# Patient Record
Sex: Female | Born: 1970 | Race: Black or African American | Hispanic: No | Marital: Single | Smoking: Never smoker
Health system: Southern US, Community
[De-identification: ages and names within clinical notes are randomized; demographics above are authoritative.]

## PROBLEM LIST (undated history)

## (undated) DIAGNOSIS — E119 Type 2 diabetes mellitus without complications: Secondary | ICD-10-CM

## (undated) DIAGNOSIS — R0683 Snoring: Secondary | ICD-10-CM

## (undated) DIAGNOSIS — E079 Disorder of thyroid, unspecified: Secondary | ICD-10-CM

## (undated) DIAGNOSIS — M329 Systemic lupus erythematosus, unspecified: Secondary | ICD-10-CM

## (undated) DIAGNOSIS — E039 Hypothyroidism, unspecified: Secondary | ICD-10-CM

## (undated) DIAGNOSIS — G4710 Hypersomnia, unspecified: Secondary | ICD-10-CM

## (undated) DIAGNOSIS — R0902 Hypoxemia: Secondary | ICD-10-CM

## (undated) DIAGNOSIS — G478 Other sleep disorders: Principal | ICD-10-CM

## (undated) DIAGNOSIS — M8580 Other specified disorders of bone density and structure, unspecified site: Secondary | ICD-10-CM

## (undated) DIAGNOSIS — T7840XA Allergy, unspecified, initial encounter: Secondary | ICD-10-CM

## (undated) HISTORY — DX: Allergy, unspecified, initial encounter: T78.40XA

## (undated) HISTORY — DX: Systemic lupus erythematosus, unspecified: M32.9

## (undated) HISTORY — DX: Hypersomnia, unspecified: G47.10

## (undated) HISTORY — DX: Other sleep disorders: G47.8

## (undated) HISTORY — DX: Type 2 diabetes mellitus without complications: E11.9

## (undated) HISTORY — DX: Snoring: R06.83

## (undated) HISTORY — DX: Hypoxemia: R09.02

## (undated) HISTORY — DX: Disorder of thyroid, unspecified: E07.9

## (undated) HISTORY — DX: Other specified disorders of bone density and structure, unspecified site: M85.80

## (undated) HISTORY — DX: Hypothyroidism, unspecified: E03.9

---

## 2001-05-30 ENCOUNTER — Emergency Department (HOSPITAL_COMMUNITY): Admission: EM | Admit: 2001-05-30 | Discharge: 2001-05-30 | Payer: Self-pay | Admitting: Emergency Medicine

## 2001-05-30 ENCOUNTER — Encounter: Payer: Self-pay | Admitting: Emergency Medicine

## 2007-10-14 HISTORY — PX: THYROIDECTOMY: SHX17

## 2014-05-01 ENCOUNTER — Ambulatory Visit (INDEPENDENT_AMBULATORY_CARE_PROVIDER_SITE_OTHER): Payer: PRIVATE HEALTH INSURANCE | Admitting: Emergency Medicine

## 2014-05-01 VITALS — BP 108/66 | HR 71 | Temp 98.4°F | Resp 16 | Ht 71.75 in | Wt 258.0 lb

## 2014-05-01 DIAGNOSIS — G4733 Obstructive sleep apnea (adult) (pediatric): Secondary | ICD-10-CM

## 2014-05-01 DIAGNOSIS — E039 Hypothyroidism, unspecified: Secondary | ICD-10-CM

## 2014-05-01 LAB — COMPREHENSIVE METABOLIC PANEL
ALT: 54 U/L — ABNORMAL HIGH (ref 0–35)
AST: 41 U/L — ABNORMAL HIGH (ref 0–37)
Albumin: 4.3 g/dL (ref 3.5–5.2)
Alkaline Phosphatase: 59 U/L (ref 39–117)
BUN: 11 mg/dL (ref 6–23)
CO2: 28 mEq/L (ref 19–32)
Calcium: 9.6 mg/dL (ref 8.4–10.5)
Chloride: 102 mEq/L (ref 96–112)
Creat: 0.7 mg/dL (ref 0.50–1.10)
Glucose, Bld: 94 mg/dL (ref 70–99)
Potassium: 3.7 mEq/L (ref 3.5–5.3)
Sodium: 138 mEq/L (ref 135–145)
Total Bilirubin: 0.6 mg/dL (ref 0.2–1.2)
Total Protein: 8.2 g/dL (ref 6.0–8.3)

## 2014-05-01 LAB — CBC WITH DIFFERENTIAL/PLATELET
Basophils Absolute: 0 10*3/uL (ref 0.0–0.1)
Basophils Relative: 0 % (ref 0–1)
Eosinophils Absolute: 0.2 10*3/uL (ref 0.0–0.7)
Eosinophils Relative: 3 % (ref 0–5)
HCT: 39.6 % (ref 36.0–46.0)
Hemoglobin: 14 g/dL (ref 12.0–15.0)
Lymphocytes Relative: 32 % (ref 12–46)
Lymphs Abs: 2.3 10*3/uL (ref 0.7–4.0)
MCH: 27.2 pg (ref 26.0–34.0)
MCHC: 35.4 g/dL (ref 30.0–36.0)
MCV: 77 fL — ABNORMAL LOW (ref 78.0–100.0)
Monocytes Absolute: 0.4 10*3/uL (ref 0.1–1.0)
Monocytes Relative: 6 % (ref 3–12)
Neutro Abs: 4.2 10*3/uL (ref 1.7–7.7)
Neutrophils Relative %: 59 % (ref 43–77)
Platelets: 246 10*3/uL (ref 150–400)
RBC: 5.14 MIL/uL — ABNORMAL HIGH (ref 3.87–5.11)
RDW: 14.2 % (ref 11.5–15.5)
WBC: 7.1 10*3/uL (ref 4.0–10.5)

## 2014-05-01 LAB — LIPID PANEL
Cholesterol: 191 mg/dL (ref 0–200)
HDL: 55 mg/dL (ref >39)
LDL Cholesterol: 116 mg/dL — ABNORMAL HIGH (ref 0–99)
Total CHOL/HDL Ratio: 3.5 Ratio
Triglycerides: 102 mg/dL (ref <150)
VLDL: 20 mg/dL (ref 0–40)

## 2014-05-01 LAB — RHEUMATOID FACTOR: Rheumatoid fact SerPl-aCnc: <10 IU/mL (ref <=14)

## 2014-05-01 MED ORDER — LEVOTHYROXINE SODIUM 150 MCG PO TABS: 150.0000 ug | ORAL_TABLET | Freq: Every day | ORAL | Status: DC

## 2014-05-01 NOTE — Progress Notes (Signed)
[facility] [address], [place] [id number] [phone] [phone]  Date:  05/01/2014   Name:  Deborah MemosJanice L Rosales   DOB:  05/22/1971   MRN:  [id number]  PCP:  No PCP Per Patient    Chief Complaint: Thyroid Problem   History of Present Illness:  Deborah Rosales is a 43 y.o. very pleasant female patient who presents with the following:  Here for the "summer" and would like to get a sleep study as she is not confident in the study done in [place].  Has history of acquired hypothyroidism and borderline lupus and needs updates on those labs as well.  Tolerating her medication doses well and needs refills.  No improvement with over the counter medications or other home remedies. Denies other complaint or health concern today.   There are no active problems to display for this patient.   Past Medical History  Diagnosis Date  . Allergy   . Osteopenia   . Thyroid disease   . Hypothyroidism   . Lupus     Past Surgical History  Procedure Laterality Date  . Thyroidectomy      History  Substance Use Topics  . Smoking status: Never Smoker   . Smokeless tobacco: Not on file  . Alcohol Use: Yes     Comment: 2    Family History  Problem Relation Age of Onset  . Hyperlipidemia Mother   . Hypertension Mother   . Hypertension Father   . Hypertension Brother   . Mental retardation Sister   . Hypertension Maternal Grandmother   . Stroke Maternal Grandmother   . Hypertension Maternal Grandfather   . Hypertension Paternal Grandmother   . Heart disease Paternal Grandmother   . Diabetes Paternal Grandfather   . Hypertension Paternal Grandfather     No Known Allergies  Medication list has been reviewed and updated.  No current outpatient prescriptions on file prior to visit.   No current facility-administered medications on file prior to visit.    Review of Systems:  As per HPI, otherwise negative.    Physical Examination: Filed Vitals:   05/01/14 1535  BP:  108/66  Pulse: 71  Temp: 98.4 F (36.9 C)  Resp: 16   Filed Vitals:   05/01/14 1535  Height: 5' 11.75" (1.822 m)  Weight: 258 lb (117.028 kg)   Body mass index is 35.25 kg/(m^2). Ideal Body Weight: Weight in (lb) to have BMI = 25: 182.7  GEN: WDWN, NAD, Non-toxic, A & O x 3 HEENT: Atraumatic, Normocephalic. Neck supple. No masses, No LAD. Ears and Nose: No external deformity. CV: RRR, No M/G/R. No JVD. No thrill. No extra heart sounds. PULM: CTA B, no wheezes, crackles, rhonchi. No retractions. No resp. distress. No accessory muscle use. ABD: S, NT, ND, +BS. No rebound. No HSM. EXTR: No c/c/e NEURO Normal gait.  PSYCH: Normally interactive. Conversant. Not depressed or anxious appearing.  Calm demeanor.    Assessment and Plan: Sleep apnea Acquired hypothyroidism off meds for 2 weeks  Signed,  Phillips OdorJeffery Raj Landress, MD

## 2014-05-02 LAB — ANA: Anti Nuclear Antibody(ANA): NEGATIVE

## 2014-05-04 ENCOUNTER — Ambulatory Visit (INDEPENDENT_AMBULATORY_CARE_PROVIDER_SITE_OTHER): Payer: PRIVATE HEALTH INSURANCE | Admitting: Neurology

## 2014-05-04 ENCOUNTER — Encounter: Payer: Self-pay | Admitting: Neurology

## 2014-05-04 VITALS — BP 128/86 | HR 77 | Resp 16 | Ht 73.0 in | Wt 244.0 lb

## 2014-05-04 DIAGNOSIS — R0609 Other forms of dyspnea: Secondary | ICD-10-CM

## 2014-05-04 DIAGNOSIS — R0989 Other specified symptoms and signs involving the circulatory and respiratory systems: Secondary | ICD-10-CM

## 2014-05-04 DIAGNOSIS — R0683 Snoring: Secondary | ICD-10-CM | POA: Insufficient documentation

## 2014-05-04 DIAGNOSIS — G4730 Sleep apnea, unspecified: Principal | ICD-10-CM

## 2014-05-04 DIAGNOSIS — G4710 Hypersomnia, unspecified: Secondary | ICD-10-CM | POA: Insufficient documentation

## 2014-05-04 HISTORY — DX: Hypersomnia, unspecified: G47.10

## 2014-05-04 HISTORY — DX: Snoring: R06.83

## 2014-05-04 NOTE — Progress Notes (Signed)
[facility]  Provider:  Melvyn Novas, M D  Referring Provider: Phillips Odor, MD Primary Care Physician:  No PCP Per Patient    OSA evaluation:   HPI:  Deborah Rosales Rosales is a 43 y.o., left handed  female, who is seen here as a referral  from Dr. Weyman Rosales for a sleep evaluation.  Deborah Rosales Rosales had been evaluated for the possibility of sleep apnea based and on a constant of snoring and daytime fatigue,  as well as morning headaches. The evaluation took place in January 2013 in [place], She was first evaluated in [place] in a specialty clinic for Ear,  Nose and Throat physicians.  A home sleep test was performed and showed that but she had some desaturations ( oxygen levels) she seemed not to have frank apneas.  The study concluded that the data collection time was too Rosales to give a valid reading. The recorded data encompassed 10 hours and 10 minutes but the validity of the recording state that only 10 minutes per day to that were usable  She also underwent a nocturnal pulse oximetry which showed her pulse rate to be +/-15 around 60 beats per minute. The study gave no evidence of frank apneas. Soon after , she moved to another city, [place] and had been scheduled at the [facility] for a repeat this time in the lab study that she could finally not attend.  She's here today to see 2-1/2 years later if there is by no evidence of sleep apnea, evidence of upper airway resistance he, will just simply snoring.  In addition, her complaint of morning headaches after waking up has increased in intensity and frequency.  She is also considered extremely daytime sleepy. She has gained weight over the last 2-1/2 years one of her concerns is a possibility of hypoventilation and CO2 retention.   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 In, she may read to 1 AM before she finally falls asleep. She also doesn't have a set rise time in summer. She has however noticed that she wakes up with headaches in the morning but is not awoken by headaches. She I was falls asleep promptly, and will sleep through the night unfortunately she will rely on more than one alarm to be able to rise in the morning. She does not wake up spontaneously. She often feels that she would like another couple of hours of sleep in the morning and feels neither refreshed nor restored. She doesn 't recall any dreams.  The patient often naps in the afternoon even after 8-10 hours of nocturnal sleep. The problem here as a gun but she does not wake up from a nap easily and that she sometimes falls asleep in the late afternoon and her now just leads into a nocturnal sleep. When she wakes up , she has a generalized headache, throbbing , retreating over the early hours of the day.   She drinks caffeine , but not excessively- one cup in AM . No energy drinks. No SODA, no iced tea. On weekends she likes to drink Beer.  She is  physically active , teaches and takes Zumba classes. She does not use tobacco in any form,      Review of Systems: Out of a complete 14 system review, the patient complains of only the following symptoms,  and all other reviewed systems are negative.The patient endorsed the Epworth sleepiness score at 16 points and fatigue severity at 46 points. She is status posture thyroidectomy left lobe and has lupus.     History   Social History  . Marital Status: Single    Spouse Name: N/A    Number of Children: 0  . Years of Education: Masters   Occupational History  .     Social History Main Topics  . Smoking status: Never Smoker   . Smokeless tobacco: Never Used  . Alcohol Use: No     Comment: 1  . Drug Use: No  . Sexual Activity: Not on file   Other Topics Concern  . Not on file   Social History Narrative   Patient is single and  lives alone.   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   Patient is left-handed.   Patient drinks three cups of coffee per week and two cups of soda per week.    Family History  Problem Relation Age of Onset  . Hyperlipidemia Mother   . Hypertension Mother   . Hypertension Father   . Hypertension Brother   . Mental retardation Sister   . Hypertension Maternal Grandmother   . Stroke Maternal Grandmother   . Hypertension Maternal Grandfather   . Hypertension Paternal Grandmother   . Heart disease Paternal Grandmother   . Hypertension Paternal Grandfather   . Glaucoma Maternal Grandfather   . Diabetes Paternal Grandmother     Past Medical History  Diagnosis Date  . Allergy   . Osteopenia   . Thyroid disease   . Hypothyroidism   . Lupus     Past Surgical History  Procedure Laterality Date  . Thyroidectomy Left 2009    Current Outpatient Prescriptions  Medication Sig Dispense Refill  . levothyroxine (SYNTHROID, LEVOTHROID) 150 MCG tablet Take 1 tablet (150 mcg total) by mouth daily before breakfast.  30 tablet  0  . UNABLE TO FIND Take by mouth daily. FEMOSTEN 1mg /5mg        No current facility-administered medications for this visit.    Allergies as of 05/04/2014  . (No Known Allergies)    Vitals: BP 128/86  Pulse 77  Resp 16  Ht 6\' 1"  (1.854 m)  Wt 244 lb (110.678 kg)  BMI 32.20 kg/m2 Last Weight:  Wt Readings from Last 1 Encounters:  05/04/14 244 lb (110.678 kg)   Last Height:   Ht Readings from Last 1 Encounters:  05/04/14 6\' 1"  (1.854 m)    Physical exam:  General: The patient is awake, alert and appears not in acute distress. The patient is well groomed. Head: Normocephalic, atraumatic. Neck is supple. Mallampati 4 , neck circumference:  17. 25 inches. No nasal septal deviation, no TMJ.  Habitual mouth breather.   Cardiovascular:  Regular rate and rhythm, without  murmurs or carotid bruit, and without distended neck  veins. Respiratory: Lungs are clear to auscultation. Skin:  Without evidence of edema, or rash Trunk: BMI is  elevated /patient has normal posture.  Neurologic exam : The patient is awake and alert, oriented to place and time.  Memory subjective described as intact.  There is a normal attention span & concentration ability. Speech is fluent without  dysarthria, dysphonia or aphasia. Mood and affect are appropriate.  Cranial nerves: Pupils are equal and briskly reactive to light. Funduscopic exam without  evidence of pallor or edema.  Extraocular movements  in  vertical and horizontal planes intact and without nystagmus. Visual fields by finger perimetry are intact. Hearing to finger rub intact.  Facial sensation intact to fine touch. Facial motor strength is symmetric and tongue and uvula move midline.  Motor exam:  Normal tone , muscle bulk and symmetric normal strength in all extremities.  Sensory:  Fine touch, pinprick and vibration were normal. Proprioception is normal.  Coordination: Rapid alternating movements in the fingers/hands is tested and normal.  Finger-to-nose maneuver tested and normal without evidence of ataxia, dysmetria or tremor.  Gait and station: Patient walks without assistive device . Strength within normal limits.  Stance is stable and normal.  Deep tendon reflexes: in the  upper and lower extremities are symmetric and intact. Babinski maneuver response is  downgoing.   Assessment:  After physical and neurologic examination, review of laboratory studies, imaging, neurophysiology testing and pre-existing records, assessment is  1) patient is known to snore very loudly, and has been gasping for air. She has nocturnal flatulence, and she has headaches in AM.  She endorses  severe daytime sleepiness, without dream intrusion, sleep paralysis, without sleep fragmentation.   Plan:  Treatment plan and additional workup : 2) SPLOT study with CO2 ,  Meeting after sleep  study ASAP before patient returns to [place].

## 2014-05-04 NOTE — Patient Instructions (Signed)
Hypersomnia Hypersomnia usually brings recurrent episodes of excessive daytime sleepiness or prolonged nighttime sleep. It is different than feeling tired due to lack of or interrupted sleep at night. People with hypersomnia are compelled to nap repeatedly during the day. This is often at inappropriate times such as:  At work.  During a meal.  In conversation. These daytime naps usually provide no relief. This disorder typically affects adolescents and young adults. CAUSES  This condition may be caused by:  Another sleep disorder (such as narcolepsy or sleep apnea).  Dysfunction of the autonomic nervous system.  Drug or alcohol abuse.  A physical problem, such as:  A tumor.  Head trauma. This is damage caused by an accident.  Injury to the central nervous system.  Certain medications, or medicine withdrawal.  Medical conditions may contribute to the disorder, including:  Multiple sclerosis.  Depression.  Encephalitis.  Epilepsy.  Obesity.  Some people appear to have a genetic predisposition to this disorder. In others, there is no known cause. SYMPTOMS   Patients often have difficulty waking from a long sleep. They may feel dazed or confused.  Other symptoms may include:  Anxiety.  Increased irritation (inflammation).  Decreased energy.  Restlessness.  Slow thinking.  Slow speech.  Loss of appetite.  Hallucinations.  Memory difficulty.  Tremors, Tics.  Some patients lose the ability to function in family, social, occupational, or other settings. TREATMENT  Treatment is symptomatic in nature. Stimulants and other drugs may be used to treat this disorder. Changes in behavior may help. For example, avoid night work and social activities that delay bed time. Changes in diet may offer some relief. Patients should avoid alcohol and caffeine. PROGNOSIS  The likely outcome (prognosis) for persons with hypersomnia depends on the cause of the disorder.  The disorder itself is not life threatening. But it can have serious consequences. For example, automobile accidents can be caused by falling asleep while driving. The attacks usually continue indefinitely. Document Released: 09/19/2002 Document Revised: 12/22/2011 Document Reviewed: 08/23/2008 ExitCare Patient Information 2015 [employer]. This information is not intended to replace advice given to you by your health care provider. Make sure you discuss any questions you have with your health care provider.  

## 2014-05-07 ENCOUNTER — Encounter (INDEPENDENT_AMBULATORY_CARE_PROVIDER_SITE_OTHER): Payer: PRIVATE HEALTH INSURANCE

## 2014-05-07 DIAGNOSIS — G4730 Sleep apnea, unspecified: Principal | ICD-10-CM

## 2014-05-07 DIAGNOSIS — G4733 Obstructive sleep apnea (adult) (pediatric): Secondary | ICD-10-CM

## 2014-05-07 DIAGNOSIS — R0683 Snoring: Secondary | ICD-10-CM

## 2014-05-07 DIAGNOSIS — G4710 Hypersomnia, unspecified: Secondary | ICD-10-CM

## 2014-05-07 DIAGNOSIS — R0902 Hypoxemia: Secondary | ICD-10-CM

## 2014-05-15 ENCOUNTER — Ambulatory Visit (INDEPENDENT_AMBULATORY_CARE_PROVIDER_SITE_OTHER): Payer: PRIVATE HEALTH INSURANCE | Admitting: Neurology

## 2014-05-15 ENCOUNTER — Encounter: Payer: Self-pay | Admitting: Neurology

## 2014-05-15 VITALS — BP 127/85 | HR 62 | Ht 73.0 in | Wt 244.0 lb

## 2014-05-15 DIAGNOSIS — G478 Other sleep disorders: Secondary | ICD-10-CM

## 2014-05-15 DIAGNOSIS — E669 Obesity, unspecified: Secondary | ICD-10-CM

## 2014-05-15 DIAGNOSIS — G4733 Obstructive sleep apnea (adult) (pediatric): Secondary | ICD-10-CM

## 2014-05-15 DIAGNOSIS — R0902 Hypoxemia: Secondary | ICD-10-CM

## 2014-05-15 DIAGNOSIS — G4481 Hypnic headache: Secondary | ICD-10-CM

## 2014-05-15 HISTORY — DX: Other sleep disorders: G47.8

## 2014-05-15 HISTORY — DX: Hypoxemia: R09.02

## 2014-05-15 NOTE — Progress Notes (Signed)
[facility]  Provider:  Melvyn Novasarmen  Deborah Rosales, M D  Referring Provider: No ref. provider found Primary Care Physician:     [facility]A evaluation:   HPI:  Deborah Rosales is a 43 y.o., left handed  female, who is seen here as a referral  from Dr. Thornton PapasJeffrey Anderson  for a sleep evaluation.   Interval history :  05-07-14  PSG performed : The sleep study revealed mild apnea , 46 torr of CO2 retention.  But the patient had low oxygen levels (nadir 79%)  for a rather prolonged period of time.  Her REM sleep dependent AHI was 11.7 RDI was 15.2 , supine  AHI was 16.4.   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 She needs to avoid supine sleep, as  only when sleeping on her back is apnea evident. We discussed the Tennisball method. She will resume weight watchers. She still has headaches in AM, hypnic headaches.  CPAP will allow for better hypoxemia control. CPAP to be set auto 5-12 cm water.  CD     Last visit.   Mr. Deborah Rosales had been evaluated for the possibility of sleep apnea based and on a constant of snoring and daytime fatigue,  as well as morning headaches. The evaluation took place in January 2013 in [place], She was first evaluated in [place] in a specialty clinic for Ear,  Nose and Throat physicians.  A home sleep test was performed and showed that but she had some desaturations ( oxygen levels) she seemed not to have frank apneas.  The study concluded that the data collection time was too short to give a valid reading. The recorded data encompassed 10 hours and 10 minutes but the validity of the recording state that only 10 minutes per day to that were usable  She also underwent a nocturnal pulse oximetry which showed her  pulse rate to be +/-15 around 60 beats per minute. The study gave no evidence of frank apneas. Soon after , she moved to another city, [place] and had been [facility] for a repeat this time in the lab study that she could finally not attend.  She's here today to see 2-1/2 years later if there is by no evidence of sleep apnea, evidence of upper airway resistance he, will just simply snoring.  In addition, her complaint of morning headaches after waking up has increased in intensity and frequency.  She is also considered extremely daytime sleepy. She has gained weight over the last 2-1/2 years one of her concerns is a possibility of hypoventilation and CO2 retention.   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 In, she may read to 1 AM before she finally falls asleep. She also doesn't have a set rise time in summer. She has however noticed that she wakes up with headaches in the morning but is not awoken by headaches. She I was falls asleep promptly, and will sleep through the night unfortunately she will rely on more than one alarm to be able to rise in the morning. She does not wake up spontaneously. She often feels that she would like  another couple of hours of sleep in the morning and feels neither refreshed nor restored. She doesn 't recall any dreams.  The patient often naps in the afternoon even after 8-10 hours of nocturnal sleep. The problem here as a gun but she does not wake up from a nap easily and that she sometimes falls asleep in the late afternoon and her now just leads into a nocturnal sleep. When she wakes up , she has a generalized headache, throbbing , retreating over the early hours of the day.   She drinks caffeine , but not excessively- one cup in AM . No energy drinks. No SODA, no iced tea. On weekends she likes to drink Beer.  She is   physically active , teaches and takes Zumba classes. She does not use tobacco in any form,      Review of Systems: Out of a complete 14 system review, the patient complains of only the following symptoms, and all other reviewed systems are negative.The patient endorsed the Epworth sleepiness score at 16 points and fatigue severity at 46 points. She is status posture thyroidectomy left lobe and has lupus.     History   Social History  . Marital Status: Single    Spouse Name: N/A    Number of Children: 0  . Years of Education: Masters   Occupational History  .     Social History Main Topics  . Smoking status: Never Smoker   . Smokeless tobacco: Never Used  . Alcohol Use: Yes     Comment: occas.  . Drug Use: No  . Sexual Activity: Not on file   Other Topics Concern  . Not on file   Social History Narrative   Patient is single and lives alone.   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   Patient is left-handed.   Patient drinks three cups of coffee per week and two cups of soda per week.    Family History  Problem Relation Age of Onset  . Hyperlipidemia Mother   . Hypertension Mother   . Hypertension Father   . Hypertension Brother   . Mental retardation Sister   . Hypertension Maternal Grandmother   . Stroke Maternal Grandmother   . Hypertension Maternal Grandfather   . Hypertension Paternal Grandmother   . Heart disease Paternal Grandmother   . Hypertension Paternal Grandfather   . Glaucoma Maternal Grandfather   . Diabetes Paternal Grandmother     Past Medical History  Diagnosis Date  . Allergy   . Osteopenia   . Thyroid disease   . Hypothyroidism   . Lupus   . 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 Snoring 05/04/2014    Past Surgical History  Procedure Laterality Date  . Thyroidectomy Left 2009    Current Outpatient Prescriptions  Medication Sig Dispense Refill  . levothyroxine (SYNTHROID, LEVOTHROID) 150 MCG tablet  Take 1 tablet (150 mcg total) by mouth daily before breakfast.  30 tablet  0  . UNABLE TO FIND Take by mouth daily. FEMOSTEN 1mg /5mg        No current facility-administered medications for this visit.    Allergies as of 05/15/2014  . (No Known Allergies)    Vitals: BP 127/85  Pulse 62  Ht 6\' 1"  (1.854 m)  Wt 244 lb (110.678 kg)  BMI 32.20 kg/m2 Last Weight:  Wt Readings from Last 1 Encounters:  05/15/14 244 lb (110.678 kg)   Last Height:   Ht Readings from  Last 1 Encounters:  05/15/14 6\' 1"  (1.854 m)    Physical exam:  General: The patient is awake, alert and appears not in acute distress. The patient is well groomed. Head: Normocephalic, atraumatic. Neck is supple. Mallampati 4 , neck circumference:  17. 25 inches. No nasal septal deviation, no TMJ.  Habitual mouth breather.   Cardiovascular:  Regular rate and rhythm, without  murmurs or carotid bruit, and without distended neck veins. Respiratory: Lungs are clear to auscultation. Skin:  Without evidence of edema, or rash Trunk: BMI is  elevated /patient has normal posture.  Neurologic exam : The patient is awake and alert, oriented to place and time.  Memory subjective described as intact.  There is a normal attention span & concentration ability. Speech is fluent without  dysarthria, dysphonia or aphasia. Mood and affect are appropriate.  Cranial nerves: Pupils are equal and briskly reactive to light. Hearing to finger rub intact.  Facial sensation intact to fine touch. Facial motor strength is symmetric and tongue and uvula move midline.  Motor exam:  Normal tone , muscle bulk and symmetric normal strength in all extremities..  Stance is stable and normal.  Deep tendon reflexes: in the  upper and lower extremities are symmetric and intact. Babinski maneuver response is  downgoing.   Assessment:  After physical and neurologic examination, review of laboratory studies, imaging, neurophysiology testing and pre-existing  records, assessment is  1) OSA with UARS, hypothyroidism, orthopnea. obesity   Plan:  Treatment plan and additional workup :I would advise for CPAP  treatment : this  mild sleep apnea with upper airway resistance syndrome can be both treated using positive airway therapy- in addition to pursue aggressive weight-loss strategies. She needs to avoid supine sleep, as  only when sleeping on her back is apnea evident. We discussed the Tennisball method. She will resume weight watchers. She still has headaches in AM, hypnic headaches.  CPAP will allow for better hypoxemia control. CPAP to be set auto 5-12 cm water.  CD

## 2014-05-19 ENCOUNTER — Other Ambulatory Visit (INDEPENDENT_AMBULATORY_CARE_PROVIDER_SITE_OTHER): Payer: PRIVATE HEALTH INSURANCE

## 2014-05-19 DIAGNOSIS — E039 Hypothyroidism, unspecified: Secondary | ICD-10-CM

## 2014-05-19 NOTE — Progress Notes (Unsigned)
Patient here today for labs only. °

## 2014-05-20 LAB — TSH: TSH: 0.709 u[IU]/mL (ref 0.350–4.500)

## 2014-05-25 ENCOUNTER — Emergency Department (HOSPITAL_COMMUNITY)
Admission: EM | Admit: 2014-05-25 | Discharge: 2014-05-25 | Disposition: A | Discharge status: Home / Self Care | Payer: 59 | Attending: Emergency Medicine | Admitting: Emergency Medicine

## 2014-05-25 ENCOUNTER — Emergency Department (HOSPITAL_COMMUNITY): Payer: 59

## 2014-05-25 ENCOUNTER — Encounter (HOSPITAL_COMMUNITY): Payer: Self-pay | Admitting: Emergency Medicine

## 2014-05-25 DIAGNOSIS — N200 Calculus of kidney: Secondary | ICD-10-CM | POA: Diagnosis not present

## 2014-05-25 DIAGNOSIS — Z8709 Personal history of other diseases of the respiratory system: Secondary | ICD-10-CM | POA: Diagnosis not present

## 2014-05-25 DIAGNOSIS — Z79899 Other long term (current) drug therapy: Secondary | ICD-10-CM | POA: Diagnosis not present

## 2014-05-25 DIAGNOSIS — E039 Hypothyroidism, unspecified: Secondary | ICD-10-CM | POA: Insufficient documentation

## 2014-05-25 DIAGNOSIS — Z8739 Personal history of other diseases of the musculoskeletal system and connective tissue: Secondary | ICD-10-CM | POA: Diagnosis not present

## 2014-05-25 DIAGNOSIS — R109 Unspecified abdominal pain: Secondary | ICD-10-CM | POA: Diagnosis present

## 2014-05-25 DIAGNOSIS — Z3202 Encounter for pregnancy test, result negative: Secondary | ICD-10-CM | POA: Diagnosis not present

## 2014-05-25 DIAGNOSIS — M329 Systemic lupus erythematosus, unspecified: Secondary | ICD-10-CM | POA: Insufficient documentation

## 2014-05-25 LAB — CBC WITH DIFFERENTIAL/PLATELET
Basophils Absolute: 0 10*3/uL (ref 0.0–0.1)
Basophils Relative: 0 % (ref 0–1)
EOS ABS: 0 10*3/uL (ref 0.0–0.7)
Eosinophils Relative: 0 % (ref 0–5)
HEMATOCRIT: 40.3 % (ref 36.0–46.0)
Hemoglobin: 13.9 g/dL (ref 12.0–15.0)
LYMPHS ABS: 1.7 10*3/uL (ref 0.7–4.0)
LYMPHS PCT: 14 % (ref 12–46)
MCH: 27.4 pg (ref 26.0–34.0)
MCHC: 34.5 g/dL (ref 30.0–36.0)
MCV: 79.5 fL (ref 78.0–100.0)
MONO ABS: 0.9 10*3/uL (ref 0.1–1.0)
Monocytes Relative: 8 % (ref 3–12)
Neutro Abs: 9.2 10*3/uL — ABNORMAL HIGH (ref 1.7–7.7)
Neutrophils Relative %: 78 % — ABNORMAL HIGH (ref 43–77)
PLATELETS: 256 10*3/uL (ref 150–400)
RBC: 5.07 MIL/uL (ref 3.87–5.11)
RDW: 12.9 % (ref 11.5–15.5)
WBC: 11.7 10*3/uL — ABNORMAL HIGH (ref 4.0–10.5)

## 2014-05-25 LAB — URINE MICROSCOPIC-ADD ON

## 2014-05-25 LAB — COMPREHENSIVE METABOLIC PANEL
ALT: 48 U/L — AB (ref 0–35)
ANION GAP: 13 (ref 5–15)
AST: 39 U/L — ABNORMAL HIGH (ref 0–37)
Albumin: 4.3 g/dL (ref 3.5–5.2)
Alkaline Phosphatase: 65 U/L (ref 39–117)
BUN: 13 mg/dL (ref 6–23)
CALCIUM: 10.1 mg/dL (ref 8.4–10.5)
CO2: 27 meq/L (ref 19–32)
Chloride: 99 mEq/L (ref 96–112)
Creatinine, Ser: 1.12 mg/dL — ABNORMAL HIGH (ref 0.50–1.10)
GFR, EST AFRICAN AMERICAN: 68 mL/min — AB (ref >90)
GFR, EST NON AFRICAN AMERICAN: 58 mL/min — AB (ref >90)
Glucose, Bld: 106 mg/dL — ABNORMAL HIGH (ref 70–99)
Potassium: 4.1 mEq/L (ref 3.7–5.3)
SODIUM: 139 meq/L (ref 137–147)
TOTAL PROTEIN: 9 g/dL — AB (ref 6.0–8.3)
Total Bilirubin: 0.5 mg/dL (ref 0.3–1.2)

## 2014-05-25 LAB — PREGNANCY, URINE: Preg Test, Ur: NEGATIVE

## 2014-05-25 LAB — URINALYSIS, ROUTINE W REFLEX MICROSCOPIC
BILIRUBIN URINE: NEGATIVE
GLUCOSE, UA: NEGATIVE mg/dL
KETONES UR: NEGATIVE mg/dL
Nitrite: NEGATIVE
PH: 5.5 (ref 5.0–8.0)
Protein, ur: NEGATIVE mg/dL
Specific Gravity, Urine: 1.02 (ref 1.005–1.030)
Urobilinogen, UA: 0.2 mg/dL (ref 0.0–1.0)

## 2014-05-25 MED ORDER — OXYCODONE-ACETAMINOPHEN 5-325 MG PO TABS: 1.0000 | ORAL_TABLET | Freq: Four times a day (QID) | ORAL | Status: DC | PRN

## 2014-05-25 MED ORDER — OXYCODONE-ACETAMINOPHEN 5-325 MG PO TABS: 1.0000 | ORAL_TABLET | ORAL | Status: DC | PRN

## 2014-05-25 MED ORDER — ONDANSETRON HCL 4 MG/2ML IJ SOLN
4.0000 mg | Freq: Once | INTRAMUSCULAR | Status: AC
  Administered 2014-05-25: 4 mg via INTRAVENOUS
  Filled 2014-05-25: qty 2

## 2014-05-25 MED ORDER — HYDROMORPHONE HCL PF 1 MG/ML IJ SOLN
1.0000 mg | Freq: Once | INTRAMUSCULAR | Status: AC
  Administered 2014-05-25: 1 mg via INTRAVENOUS
  Filled 2014-05-25: qty 1

## 2014-05-25 NOTE — Discharge Instructions (Signed)
Drink plenty of fluids.  Follow up with your md when you get home.   Return if problems

## 2014-05-25 NOTE — ED Notes (Addendum)
Pt reports was seen at urgent care for same this am and was diagnosed with pelvic inflammation. Pt reports abdominal pain since this am. Pt was given a px for 2 abx and given a phenergan suppository at d/c from urgent care. Pt reports no improvement in abdominal pain. Pt reports decreased urine production, intermittent n/v. Pt alert, reports fatigue and malaise.

## 2014-05-25 NOTE — ED Notes (Signed)
Patient verbalizes understanding of discharge instructions, prescription medications, home care and follow up care. Patient ambulatory out of department at this time, escorted by family member

## 2014-05-25 NOTE — ED Notes (Signed)
Pt reports waking up with a huge pain in lower left side and had trouble urinating.  Pt reports she thought it was a UTI so she went to Urgent care and diagnosed pt with pelvic inflammation and was given an injection of rocephin.  Pt has n/v but denies any diarrhea.  Pt took suppository and doxycylcine hyclate and Metronidazole.

## 2014-05-25 NOTE — ED Provider Notes (Addendum)
CSN: [id number]     Arrival date & time 05/25/14  1750 History   First MD Initiated Contact with Patient 05/25/14 1838   This chart was scribed for Benny Lennert, MD by Gwenevere Abbot, ED scribe. This patient was seen in room APA05/APA05 and the patient's care was started at 6:41 PM.    Chief Complaint  Patient presents with  . Abdominal Pain    Patient is a 43 y.o. female presenting with abdominal pain. The history is provided by the patient. No language interpreter was used.  Abdominal Pain Pain location:  LUQ Pain radiates to:  Does not radiate Pain severity:  Moderate Timing:  Constant Progression:  Worsening Relieved by:  Nothing Associated symptoms: no chest pain, no cough, no diarrhea, no fatigue and no hematuria    HPI Comments:  Deborah Rosales is a 43 y.o. female who presents to the Emergency Department complaining of constant left upper abdominal pain, along with associated symptoms of  trouble urinating,and vomiting. Pt is unaware of LMP due to irregular cycle.   Past Medical History  Diagnosis Date  . Allergy   . Osteopenia   . Thyroid disease   . Hypothyroidism   . Lupus   . 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 Snoring 05/04/2014  . UARS (upper airway resistance syndrome) 05/15/2014  . Hypoxemia 05/15/2014   Past Surgical History  Procedure Laterality Date  . Thyroidectomy Left 2009   Family History  Problem Relation Age of Onset  . Hyperlipidemia Mother   . Hypertension Mother   . Hypertension Father   . Hypertension Brother   . Mental retardation Sister   . Hypertension Maternal Grandmother   . Stroke Maternal Grandmother   . Hypertension Maternal Grandfather   . Hypertension Paternal Grandmother   . Heart disease Paternal Grandmother   . Hypertension Paternal Grandfather   . Glaucoma Maternal Grandfather   . Diabetes Paternal Grandmother    History  Substance Use Topics  . Smoking status: Never Smoker   . Smokeless tobacco: Never Used  . Alcohol  Use: Yes     Comment: occas.   OB History   Grav Para Term Preterm Abortions TAB SAB Ect Mult Living                 Review of Systems  Constitutional: Negative for appetite change and fatigue.  HENT: Negative for congestion, ear discharge and sinus pressure.   Eyes: Negative for discharge.  Respiratory: Negative for cough.   Cardiovascular: Negative for chest pain.  Gastrointestinal: Positive for abdominal pain. Negative for diarrhea.  Genitourinary: Negative for frequency and hematuria.  Musculoskeletal: Negative for back pain.  Skin: Negative for rash.  Neurological: Negative for seizures and headaches.  Psychiatric/Behavioral: Negative for hallucinations.      Allergies  Influenza vaccines  Home Medications   Prior to Admission medications   Medication Sig Start Date End Date Taking? Authorizing Provider  doxycycline (VIBRAMYCIN) 100 MG capsule Take 100 mg by mouth 2 (two) times daily. 14 day course starting on 05/25/2014   Yes Historical Provider, MD  levothyroxine (SYNTHROID, LEVOTHROID) 150 MCG tablet Take 1 tablet (150 mcg total) by mouth daily before breakfast. 05/01/14  Yes Phillips Odor, MD  metroNIDAZOLE (FLAGYL) 500 MG tablet Take 500 mg by mouth 2 (two) times daily.   Yes Historical Provider, MD  promethazine (PHENERGAN) 25 MG suppository Place 25 mg rectally every 6 (six) hours as needed for nausea or vomiting. 14 day course starting on 05/25/2014  Yes Historical Provider, MD   BP 133/86  Pulse 83  Temp(Src) 99.2 F (37.3 C) (Oral)  Resp 20  Ht 6' (1.829 m)  Wt 242 lb (109.77 kg)  BMI 32.81 kg/m2  SpO2 100% Physical Exam  Constitutional: She is oriented to person, place, and time. She appears well-developed.  HENT:  Head: Normocephalic.  Eyes: Conjunctivae and EOM are normal. No scleral icterus.  Neck: Neck supple. No thyromegaly present.  Cardiovascular: Normal rate and regular rhythm.  Exam reveals no gallop and no friction rub.   No murmur  heard. Pulmonary/Chest: No stridor. She has no wheezes. She has no rales. She exhibits no tenderness.  Abdominal: She exhibits no distension. There is tenderness (Moderate LUQ tenderness). There is no rebound.  Musculoskeletal: Normal range of motion. She exhibits no edema.  Lymphadenopathy:    She has no cervical adenopathy.  Neurological: She is oriented to person, place, and time. She exhibits normal muscle tone. Coordination normal.  Skin: No rash noted. No erythema.  Psychiatric: She has a normal mood and affect. Her behavior is normal.    ED Course  Procedures  DIAGNOSTIC STUDIES: Oxygen Saturation is 100% on RA, normal by my interpretation.  COORDINATION OF CARE: 6:44 PM-Discussed treatment plan with pt at bedside and pt agreed to plan.  Labs Review Labs Reviewed  CBC WITH DIFFERENTIAL - Abnormal; Notable for the following:    WBC 11.7 (*)    Neutrophils Relative % 78 (*)    Neutro Abs 9.2 (*)    All other components within normal limits  COMPREHENSIVE METABOLIC PANEL - Abnormal; Notable for the following:    Glucose, Bld 106 (*)    Creatinine, Ser 1.12 (*)    Total Protein 9.0 (*)    AST 39 (*)    ALT 48 (*)    GFR calc non Af Amer 58 (*)    GFR calc Af Amer 68 (*)    All other components within normal limits  URINALYSIS, ROUTINE W REFLEX MICROSCOPIC - Abnormal; Notable for the following:    Hgb urine dipstick SMALL (*)    Leukocytes, UA TRACE (*)    All other components within normal limits  URINE MICROSCOPIC-ADD ON - Abnormal; Notable for the following:    Squamous Epithelial / LPF FEW (*)    Bacteria, UA FEW (*)    All other components within normal limits  PREGNANCY, URINE    Imaging Review No results found.   EKG Interpretation None      MDM   Final diagnoses:  None   Kidney stone      Benny LennertJoseph L Senai Kingsley, MD 05/25/14 2038  Benny LennertJoseph L Surina Storts, MD 05/25/14 2214

## 2014-06-02 MED FILL — Oxycodone w/ Acetaminophen Tab 5-325 MG: ORAL | Qty: 6 | Status: AC

## 2015-02-01 ENCOUNTER — Telehealth: Payer: Self-pay

## 2015-02-01 NOTE — Telephone Encounter (Signed)
Called patient and left her a message stating we need to reschedule her apt. On 04-03-2015 with Dr.Dohmeier. When patient calls back she will need to go in a thirty minute  Slot. Please reschedule the patient when she call's back. Thanks Annabelle Harmanana.  Sleep Patient.

## 2015-02-11 ENCOUNTER — Encounter: Payer: Self-pay | Admitting: Neurology

## 2015-02-20 NOTE — Telephone Encounter (Signed)
[name].  Sorry for the inconvenience.  We have kept your appt for the same day 04-03-15, but have changed to a later time 3:30pm.  Please let us know if this does not work out.  Thank you.

## 2015-04-02 ENCOUNTER — Other Ambulatory Visit: Payer: Self-pay

## 2015-04-02 ENCOUNTER — Telehealth: Payer: Self-pay | Admitting: Neurology

## 2015-04-02 ENCOUNTER — Telehealth: Payer: Self-pay

## 2015-04-02 DIAGNOSIS — E039 Hypothyroidism, unspecified: Secondary | ICD-10-CM

## 2015-04-02 NOTE — Telephone Encounter (Signed)
Patient is in [place] and would like to speak with Dr. Dareen Piano about getting a year long refill for levothyroxine. She can be reached through e-mail or [email or web address]

## 2015-04-02 NOTE — Telephone Encounter (Signed)
Spoke to pt, she is flying in from [place] tomorrow and wants to know if there is an appt this week with Dr. Vickey Huger. She also wants an endocrinology referral as she and Dr. Vickey Huger talked about last visit. Asked Dr. Vickey Huger if it was ok to work her in at 8:00 on Thursday, she said it was ok, and gave an order for pt to be referred to endocrinology to Dr. Lafe Garin at [facility].

## 2015-04-03 ENCOUNTER — Ambulatory Visit: Payer: Self-pay | Admitting: Neurology

## 2015-04-03 ENCOUNTER — Ambulatory Visit: Payer: PRIVATE HEALTH INSURANCE | Admitting: Neurology

## 2015-04-03 NOTE — Telephone Encounter (Signed)
Can we send her in a year's worth of levothyroxine?

## 2015-04-03 NOTE — Telephone Encounter (Signed)
Tomorrow, I think not. There are after all doctors in [place] and we have not seen her in a year. She needs regular care and we cannot provide it [place] apart. JA

## 2015-04-03 NOTE — Telephone Encounter (Signed)
Sent message through my chart letting pt know.  

## 2015-04-05 ENCOUNTER — Telehealth: Payer: Self-pay | Admitting: Neurology

## 2015-04-05 ENCOUNTER — Other Ambulatory Visit: Payer: Self-pay

## 2015-04-05 ENCOUNTER — Ambulatory Visit: Payer: Self-pay | Admitting: Neurology

## 2015-04-05 DIAGNOSIS — E039 Hypothyroidism, unspecified: Secondary | ICD-10-CM

## 2015-04-05 NOTE — Telephone Encounter (Signed)
Received a call from patient inquiring why her Internal Referral to [facility] was canceled. Advised patient I will request a new referral. Message sent to nurse for new request.

## 2015-04-05 NOTE — Telephone Encounter (Signed)
Outreached to patient and advised the her referral for to [facility] for Dr. Elvera Lennox has been resubmitted and that office will contact her for an appointment. Also listed patient temp contact ph# [phone] on the referral

## 2015-04-05 NOTE — Telephone Encounter (Signed)
Referral order placed again.

## 2015-04-05 NOTE — Progress Notes (Signed)
The referral was cancelled by [facility] because they thought the pt's primary address was [place]. Referral placed again. Our referral coordinator will contact them and the pt.

## 2015-04-09 ENCOUNTER — Encounter: Payer: Self-pay | Admitting: Neurology

## 2015-05-01 ENCOUNTER — Ambulatory Visit (INDEPENDENT_AMBULATORY_CARE_PROVIDER_SITE_OTHER): Payer: 59 | Admitting: Endocrinology

## 2015-05-01 ENCOUNTER — Telehealth: Payer: Self-pay | Admitting: Endocrinology

## 2015-05-01 ENCOUNTER — Encounter: Payer: Self-pay | Admitting: Endocrinology

## 2015-05-01 VITALS — BP 134/87 | HR 86 | Temp 98.5°F | Ht 73.0 in | Wt 239.0 lb

## 2015-05-01 DIAGNOSIS — Z0189 Encounter for other specified special examinations: Secondary | ICD-10-CM

## 2015-05-01 DIAGNOSIS — E039 Hypothyroidism, unspecified: Secondary | ICD-10-CM | POA: Insufficient documentation

## 2015-05-01 DIAGNOSIS — E890 Postprocedural hypothyroidism: Secondary | ICD-10-CM | POA: Diagnosis not present

## 2015-05-01 DIAGNOSIS — Z0000 Encounter for general adult medical examination without abnormal findings: Secondary | ICD-10-CM

## 2015-05-01 MED ORDER — LEVOTHYROXINE SODIUM 150 MCG PO TABS: 150.0000 ug | ORAL_TABLET | Freq: Every day | ORAL | Status: DC

## 2015-05-01 NOTE — Progress Notes (Signed)
Subjective:    Patient ID: Deborah Rosales, female    DOB: 05/13/1971, 44 y.o.   MRN: [id number]  HPI In 2009, pt had left thyroid lobectomy for a goiter. No malignancy was found.  she has been on prescribed thyroid hormone therapy since then.  she has never taken kelp or any other type of non-prescribed thyroid product.  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  She says it showed only the remaining right right lobe.  She is not considering a pregnancy.  she has never had XRT to the neck.  she has never been on amiodarone or lithium.  She ran out of the medication approx 3 months ago, as she has trouble getting it.  She has moderate arthralgias throughout the body, and assoc weight gain.   Past Medical History  Diagnosis Date  . Allergy   . Osteopenia   . Thyroid disease   . Hypothyroidism   . Lupus   . 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 Snoring 05/04/2014  . UARS (upper airway resistance syndrome) 05/15/2014  . Hypoxemia 05/15/2014    Past Surgical History  Procedure Laterality Date  . Thyroidectomy Left 2009    History   Social History  . Marital Status: Single    Spouse Name: N/A  . Number of Children: 0  . Years of Education: Masters   Occupational History  .     Social History Main Topics  . Smoking status: Never Smoker   . Smokeless tobacco: Never Used  . Alcohol Use: Yes     Comment: occas.  . Drug Use: No  . Sexual Activity: No   Other Topics Concern  . Not on file   Social History Narrative   Patient is single and lives alone.   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   Patient is left-handed.   Patient drinks three cups of coffee per week and two cups of soda per week.    No current outpatient prescriptions on file prior to visit.   No current facility-administered medications on file prior to visit.    Allergies  Allergen Reactions  . Influenza Vaccines Hives    Family History   Problem Relation Age of Onset  . Hyperlipidemia Mother   . Hypertension Mother   . Hypertension Father   . Hypertension Brother   . Mental retardation Sister   . Hypertension Maternal Grandmother   . Stroke Maternal Grandmother   . Hypertension Maternal Grandfather   . Hypertension Paternal Grandmother   . Heart disease Paternal Grandmother   . Hypertension Paternal Grandfather   . Glaucoma Maternal Grandfather   . Diabetes Paternal Grandmother   . Thyroid disease Maternal Grandmother     BP 134/87 mmHg  Pulse 86  Temp(Src) 98.5 F (36.9 C) (Oral)  Ht  (1.854 m)  Wt 239 lb (108.41 kg)  BMI 31.54 kg/m2  SpO2 98%  Review of Systems denies hair loss, sob, constipation, numbness, blurry vision, rhinorrhea, easy bruising, and syncope.  She has fatigue, leg cramps, cold intolerance, dry skin, and mild depression.      Objective:   Physical Exam VS: see vs page GEN: no distress HEAD: head: no deformity eyes: no periorbital swelling, no proptosis external nose and ears are normal mouth: no lesion seen NECK: supple, thyroid is not enlarged CHEST WALL: no deformity LUNGS:  Clear to auscultation.   CV: reg rate  and rhythm, no murmur ABD: abdomen is soft, nontender.  no hepatosplenomegaly.  not distended.  no hernia MUSCULOSKELETAL: muscle bulk and strength are grossly normal.  no obvious joint swelling.  gait is normal and steady EXTEMITIES: no deformity.  no edema PULSES: no carotid bruit NEURO:  cn 2-12 grossly intact.   readily moves all 4's.  sensation is intact to touch on all 4's.   SKIN:  Normal texture and temperature.  No rash or suspicious lesion is visible.   NODES:  None palpable at the neck PSYCH: alert, well-oriented.  Does not appear anxious nor depressed.   Lab Results  Component Value Date   TSH 0.709 05/19/2014   i personally reviewed electrocardiogram tracing (05/25/14): low voltage.      Assessment & Plan:  Postsurgical hypothyroidism.  She  needs to resume rx.   Patient is advised the following: Patient Instructions  i have sent a prescription to your pharmacy, to refill the medication for 1 year. Please come back for a blood test in 1 month. Please return in 1 year. Please bring us a copy of the 2014 ultrasound report, if you can.      Hypothyroidism The thyroid is a large gland located in the lower front of your neck. The thyroid gland helps control metabolism. Metabolism is how your body handles food. It controls metabolism with the hormone thyroxine. When this gland is underactive (hypothyroid), it produces too little hormone.  CAUSES These include:   Absence or destruction of thyroid tissue.  Goiter due to iodine deficiency.  Goiter due to medications.  Congenital defects (since birth).  Problems with the pituitary. This causes a lack of TSH (thyroid stimulating hormone). This hormone tells the thyroid to turn out more hormone. SYMPTOMS  Lethargy (feeling as though you have no energy)  Cold intolerance  Weight gain (in spite of normal food intake)  Dry skin  Coarse hair  Menstrual irregularity (if severe, may lead to infertility)  Slowing of thought processes Cardiac problems are also caused by insufficient amounts of thyroid hormone. Hypothyroidism in the newborn is cretinism, and is an extreme form. It is important that this form be treated adequately and immediately or it will lead rapidly to retarded physical and mental development. DIAGNOSIS  To prove hypothyroidism, your caregiver may do blood tests and ultrasound tests. Sometimes the signs are hidden. It may be necessary for your caregiver to watch this illness with blood tests either before or after diagnosis and treatment. TREATMENT  Low levels of thyroid hormone are increased by using synthetic thyroid hormone. This is a safe, effective treatment. It usually takes about four weeks to gain the full effects of the medication. After you have the  full effect of the medication, it will generally take another four weeks for problems to leave. Your caregiver may start you on low doses. If you have had heart problems the dose may be gradually increased. It is generally not an emergency to get rapidly to normal. HOME CARE INSTRUCTIONS   Take your medications as your caregiver suggests. Let your caregiver know of any medications you are taking or start taking. Your caregiver will help you with dosage schedules.  As your condition improves, your dosage needs may increase. It will be necessary to have continuing blood tests as suggested by your caregiver.  Report all suspected medication side effects to your caregiver. SEEK MEDICAL CARE IF: Seek medical care if you develop:  Sweating.  Tremulousness (tremors).  Anxiety.  Rapid weight loss.  Heat intolerance.  Emotional swings.  Diarrhea.  Weakness. SEEK IMMEDIATE MEDICAL CARE IF:  You develop chest pain, an irregular heart beat (palpitations), or a rapid heart beat. MAKE SURE YOU:   Understand these instructions.  Will watch your condition.  Will get help right away if you are not doing well or get worse. Document Released: 09/29/2005 Document Revised: 12/22/2011 Document Reviewed: 05/19/2008 [facility] Patient Information 2015 [place], [place]. This information is not intended to replace advice given to you by your health care provider. Make sure you discuss any questions you have with your health care provider.

## 2015-05-01 NOTE — Telephone Encounter (Signed)
Patient called back and would like her year supply of levothyroxine sent to her pharmacy   Rx: levothyroxine   [facility]    Thank you

## 2015-05-01 NOTE — Patient Instructions (Addendum)
i have sent a prescription to your pharmacy, to refill the medication for 1 year. Please come back for a blood test in 1 month. Please return in 1 year. Please bring us a copy of the 2014 ultrasound report, if you can.      Hypothyroidism The thyroid is a large gland located in the lower front of your neck. The thyroid gland helps control metabolism. Metabolism is how your body handles food. It controls metabolism with the hormone thyroxine. When this gland is underactive (hypothyroid), it produces too little hormone.  CAUSES These include:   Absence or destruction of thyroid tissue.  Goiter due to iodine deficiency.  Goiter due to medications.  Congenital defects (since birth).  Problems with the pituitary. This causes a lack of TSH (thyroid stimulating hormone). This hormone tells the thyroid to turn out more hormone. SYMPTOMS  Lethargy (feeling as though you have no energy)  Cold intolerance  Weight gain (in spite of normal food intake)  Dry skin  Coarse hair  Menstrual irregularity (if severe, may lead to infertility)  Slowing of thought processes Cardiac problems are also caused by insufficient amounts of thyroid hormone. Hypothyroidism in the newborn is cretinism, and is an extreme form. It is important that this form be treated adequately and immediately or it will lead rapidly to retarded physical and mental development. DIAGNOSIS  To prove hypothyroidism, your caregiver may do blood tests and ultrasound tests. Sometimes the signs are hidden. It may be necessary for your caregiver to watch this illness with blood tests either before or after diagnosis and treatment. TREATMENT  Low levels of thyroid hormone are increased by using synthetic thyroid hormone. This is a safe, effective treatment. It usually takes about four weeks to gain the full effects of the medication. After you have the full effect of the medication, it will generally take another four weeks for  problems to leave. Your caregiver may start you on low doses. If you have had heart problems the dose may be gradually increased. It is generally not an emergency to get rapidly to normal. HOME CARE INSTRUCTIONS   Take your medications as your caregiver suggests. Let your caregiver know of any medications you are taking or start taking. Your caregiver will help you with dosage schedules.  As your condition improves, your dosage needs may increase. It will be necessary to have continuing blood tests as suggested by your caregiver.  Report all suspected medication side effects to your caregiver. SEEK MEDICAL CARE IF: Seek medical care if you develop:  Sweating.  Tremulousness (tremors).  Anxiety.  Rapid weight loss.  Heat intolerance.  Emotional swings.  Diarrhea.  Weakness. SEEK IMMEDIATE MEDICAL CARE IF:  You develop chest pain, an irregular heart beat (palpitations), or a rapid heart beat. MAKE SURE YOU:   Understand these instructions.  Will watch your condition.  Will get help right away if you are not doing well or get worse. Document Released: 09/29/2005 Document Revised: 12/22/2011 Document Reviewed: 05/19/2008 [facility] Patient Information 2015 [place]. This information is not intended to replace advice given to you by your health care provider. Make sure you discuss any questions you have with your health care provider.

## 2015-05-01 NOTE — Telephone Encounter (Signed)
Patient called regarding her Rx   Rx: Levothyroxine needs to be a 30 day supply at this time   [facility]   Thank you

## 2015-05-02 MED ORDER — LEVOTHYROXINE SODIUM 150 MCG PO TABS: 150.0000 ug | ORAL_TABLET | Freq: Every day | ORAL | Status: DC

## 2015-05-02 NOTE — Telephone Encounter (Signed)
Rx for Levothyroxine 150 mcg sent to pt's pharmacy. I tried to contact the pt to advise. Pt was unavailable.

## 2015-05-02 NOTE — Addendum Note (Signed)
Addended by: Bethann PunchesUCK, Mavrick Mcquigg E on: 05/02/2015 08:28 AM   Modules accepted: Orders

## 2015-05-03 ENCOUNTER — Other Ambulatory Visit: Payer: Self-pay

## 2015-05-03 MED ORDER — LEVOTHYROXINE SODIUM 150 MCG PO TABS: 150.0000 ug | ORAL_TABLET | Freq: Every day | ORAL | Status: DC

## 2015-05-04 ENCOUNTER — Other Ambulatory Visit: Payer: Self-pay

## 2015-05-04 ENCOUNTER — Telehealth: Payer: Self-pay | Admitting: Endocrinology

## 2015-05-04 MED ORDER — LEVOTHYROXINE SODIUM 150 MCG PO TABS: 150.0000 ug | ORAL_TABLET | Freq: Every day | ORAL | Status: DC

## 2015-05-04 NOTE — Telephone Encounter (Signed)
Pt calling regarding levothyroxine rx

## 2015-05-14 ENCOUNTER — Encounter: Payer: Self-pay | Admitting: Neurology

## 2015-05-14 ENCOUNTER — Ambulatory Visit (INDEPENDENT_AMBULATORY_CARE_PROVIDER_SITE_OTHER): Payer: PRIVATE HEALTH INSURANCE | Admitting: Neurology

## 2015-05-14 VITALS — BP 118/76 | HR 84 | Resp 20 | Ht 71.0 in | Wt 244.0 lb

## 2015-05-14 DIAGNOSIS — G478 Other sleep disorders: Secondary | ICD-10-CM

## 2015-05-14 DIAGNOSIS — E669 Obesity, unspecified: Secondary | ICD-10-CM | POA: Diagnosis not present

## 2015-05-14 DIAGNOSIS — Z9989 Dependence on other enabling machines and devices: Principal | ICD-10-CM

## 2015-05-14 DIAGNOSIS — G4733 Obstructive sleep apnea (adult) (pediatric): Secondary | ICD-10-CM | POA: Diagnosis not present

## 2015-05-14 NOTE — Patient Instructions (Signed)

## 2015-05-14 NOTE — Progress Notes (Signed)
[facility]  Provider:  Melvyn Novas, M D  Referring Provider: No ref. provider found Primary Care Physician:     [facility]A evaluation:   HPI:  Deborah Rosales is a 44 y.o., left handed  female, who is seen here as a referral  from Dr. Thornton Papas  for CPAP compliance.   Interval history :Mr. Boesch had been evaluated for the possibility of sleep apnea based and on a constant of snoring and daytime fatigue,  as well as morning headaches. The evaluation took place in January 2013 in [place], She was first evaluated in [place] in a specialty clinic for Ear,  Nose and Throat physicians.  A home sleep test was performed and showed that but she had some desaturations ( oxygen levels) she seemed not to have frank apneas.  The study concluded that the data collection time was too short to give a valid reading. The recorded data encompassed 10 hours and 10 minutes but the validity of the recording state that only 10 minutes per day to that were usable  She also underwent a nocturnal pulse oximetry which showed her pulse rate to be +/-15 around 60 beats per minute. The study gave no evidence of frank apneas. Soon after , she moved to another city, [place] and had been scheduled at the [facility] for a repeat this time in the lab study that she could finally not attend.  She's here today to see 2-1/2 years later if there is by no evidence of sleep apnea, evidence of upper airway resistance he, will just simply snoring.  In addition, her complaint of morning headaches after waking up has increased in intensity and frequency.  She is also considered extremely daytime sleepy. She has gained weight over the last 2-1/2 years one of her concerns is a possibility of hypoventilation and CO2 retention.   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 In, she may read to 1 AM before she finally falls asleep. She also doesn't have a set rise time in summer. She has however noticed that she wakes up with headaches in the morning but is not awoken by headaches. She I was falls asleep promptly, and will sleep through the night unfortunately she will rely on more than one alarm to be able to rise in the morning. She does not wake up spontaneously. She often feels that she would like another couple of hours of sleep in the morning and feels neither refreshed nor restored. She doesn 't recall any dreams.  The patient often naps in the afternoon even after 8-10 hours of nocturnal sleep. The problem here as a gun but she does not wake up from a nap easily and that she sometimes falls asleep in the late afternoon and her now just leads into a nocturnal sleep. When she wakes up , she has a generalized headache, throbbing , retreating over the early hours of the day.   She drinks caffeine , but not excessively- one cup in AM . No energy drinks. No SODA, no iced tea. On weekends she likes to drink Beer.  She is  physically active , teaches and takes Zumba classes. She does not use tobacco in any form,   05-07-14  PSG performed : The sleep study revealed mild apnea , 46 torr of CO2 retention.  But the patient had  low oxygen levels (nadir 79%)  for a rather prolonged period of time.  Her REM sleep dependent AHI was 11.7 RDI was 15.2 , supine  AHI was 16.4. 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 She needs to avoid supine sleep, as  only when sleeping on her back is apnea evident. We discussed the Tennisball method. She will resume weight watchers. She still has headaches in AM, hypnic headaches.  CPAP will allow for better  hypoxemia control. CPAP to be set auto 5-12 cm water.  CD   Interval history from 05-14-15. [name] is here for her yearly revisit she came all the way from [place]. I and had a very mild form of apnea more accentuated upper airway resistance he syndrome and tolerate CPAP now very well. Her compliance is excellent 6 hours and 37 minutes on average at night 87% compliance for over 4 hours of consecutive use. She continues to use the multiple set and the AHI is 0.8. She has increased her exercise regimen but she has been frustrated that she hasn't lost weight in the meantime her Epworth sleepiness score is still mildly elevated at 12 points and her fatigue severity score is 40 points however these numbers are comparable to the last visit she was prior to using CPAP endorsing the Epworth score at 16 points.  Since she has a height of 6 foot 1 ", her BMI is 32.2.  She is not on any new medications.         Review of Systems: Out of a complete 14 system review, the patient complains of only the following symptoms, and all other reviewed systems are negative.The patient endorsed the Epworth sleepiness score at  12 from 16 points and fatigue severity at  40 from 46 points. She is status posture thyroidectomy left lobe and has lupus.     History   Social History  . Marital Status: Single    Spouse Name: N/A  . Number of Children: 0  . Years of Education: Masters   Occupational History  .     Social History Main Topics  . Smoking status: Never Smoker   . Smokeless tobacco: Never Used  . Alcohol Use: Yes     Comment: occas.  . Drug Use: No  . Sexual Activity: No   Other Topics Concern  . Not on file   Social History Narrative   Patient is single and lives alone.   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   Patient is left-handed.   Patient drinks three cups of coffee per week and two cups of soda per week.    Family History  Problem Relation Age of  Onset  . Hyperlipidemia Mother   . Hypertension Mother   . Hypertension Father   . Hypertension Brother   . Mental retardation Sister   . Hypertension Maternal Grandmother   . Stroke Maternal Grandmother   . Hypertension Maternal Grandfather   . Hypertension Paternal Grandmother   . Heart disease Paternal Grandmother   . Hypertension Paternal Grandfather   . Glaucoma Maternal Grandfather   . Diabetes Paternal Grandmother   . Thyroid disease Maternal Grandmother     Past Medical History  Diagnosis Date  . Allergy   .  Osteopenia   . Thyroid disease   . Hypothyroidism   . Lupus   . 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 Snoring 05/04/2014  . UARS (upper airway resistance syndrome) 05/15/2014  . Hypoxemia 05/15/2014    Past Surgical History  Procedure Laterality Date  . Thyroidectomy Left 2009    Current Outpatient Prescriptions  Medication Sig Dispense Refill  . estradiol (CLIMARA - DOSED IN MG/24 HR) 0.0375 mg/24hr patch Place 0.0375 mg onto the skin once a week.    . levothyroxine (SYNTHROID, LEVOTHROID) 150 MCG tablet Take 1 tablet (150 mcg total) by mouth daily before breakfast. 365 tablet 0   No current facility-administered medications for this visit.    Allergies as of 05/14/2015 - Review Complete 05/14/2015  Allergen Reaction Noted  . Influenza vaccines Hives 05/25/2014    Vitals: BP 118/76 mmHg  Pulse 84  Resp 20  Ht 5\' 11"  (1.803 m)  Wt 244 lb (110.678 kg)  BMI 34.05 kg/m2 Last Weight:  Wt Readings from Last 1 Encounters:  05/14/15 244 lb (110.678 kg)   Last Height:   Ht Readings from Last 1 Encounters:  05/14/15 5\' 11"  (1.803 m)    Physical exam:  General: The patient is awake, alert and appears not in acute distress. The patient is well groomed. Head: Normocephalic, atraumatic. Neck is supple. Mallampati 4 , neck circumference:  17. 25 inches. No nasal septal deviation, no TMJ.  Habitual mouth breather.   Cardiovascular:  Regular rate and  rhythm, without  murmurs or carotid bruit, and without distended neck veins. Respiratory: Lungs are clear to auscultation. Skin:  Without evidence of edema, or rash Trunk: BMI is  elevated /patient has normal posture.  Neurologic exam : The patient is awake and alert, oriented to place and time.  Memory subjective described as intact.  There is a normal attention span & concentration ability. Speech is fluent without  dysarthria, dysphonia or aphasia.  Cranial nerves: Pupils are equal and briskly reactive to light. Hearing to finger rub intact.  Facial sensation intact to fine touch. Facial motor strength is symmetric and tongue and uvula move midline. Motor exam:  Normal tone , muscle bulk and symmetric normal strength in all extremities..  Stance is stable and normal.  Deep tendon reflexes: in the  upper and lower extremities are symmetric and intact. Babinski maneuver response is  downgoing.   Assessment:  After physical and neurologic examination, review of laboratory studies, imaging, neurophysiology testing and pre-existing records, 15 minute assessment is  1) OSA with UARS, on CPAP  2) hypothyroidism,stabile  3)orthopnea- much improved . 4) obesity. 5) resolved nocturia.    Plan:  Treatment plan and additional workup : continue CPAP  treatment :   her mild sleep apnea with upper airway resistance syndrome can be treated using positive airway therapy- She needs to avoid supine sleep, as  only when sleeping on her back is apnea evident. We discussed the Tennisball method.   She will resume weight watchers.   She still has headaches in AM, hypnic headaches.  CPAP k allowed  for better hypoxemia control.  CPAP  set auto 5-12 cm water.     Mrs. Gasior has been very compliant with her CPAP she has used it 100% of the last 30 days and 87% of those night over 4 consecutive hours. Her average user time is 6 hours and 32 minutes. The machine is an old total set between 5 and 12 cm water  with 3 cm EPR. The  AH I is 0.8 which is an excellent resolution of apnea the 91st percentile pressure is 8.4 based on this no changes in settings have to be done and the patient also does not have high air leaks that would make a change of the interface necessary. She averages between 12 and 15K of steps a day.

## 2015-05-25 ENCOUNTER — Telehealth: Payer: Self-pay | Admitting: Endocrinology

## 2015-05-25 NOTE — Telephone Encounter (Signed)
See not below to be advised, Pt is scheduled to see [place] on 05/28/15.

## 2015-05-25 NOTE — Telephone Encounter (Signed)
Pt has had labs done elsewhere and would like to be seen

## 2015-05-25 NOTE — Telephone Encounter (Signed)
Ok, 9:15 am, 05/28/15

## 2015-05-28 ENCOUNTER — Other Ambulatory Visit: Payer: 59

## 2015-05-28 ENCOUNTER — Ambulatory Visit (INDEPENDENT_AMBULATORY_CARE_PROVIDER_SITE_OTHER): Payer: 59 | Admitting: Endocrinology

## 2015-05-28 ENCOUNTER — Encounter: Payer: Self-pay | Admitting: Endocrinology

## 2015-05-28 VITALS — BP 136/84 | HR 96 | Temp 97.9°F | Ht 71.0 in | Wt 245.0 lb

## 2015-05-28 DIAGNOSIS — K769 Liver disease, unspecified: Secondary | ICD-10-CM | POA: Insufficient documentation

## 2015-05-28 DIAGNOSIS — E890 Postprocedural hypothyroidism: Secondary | ICD-10-CM | POA: Diagnosis not present

## 2015-05-28 NOTE — Patient Instructions (Addendum)
blood tests are requested for you today.  We'll let you know about the results.   Please return in 1 year.   Please bring [place] a copy of the 2014 ultrasound report, if you can.

## 2015-05-28 NOTE — Progress Notes (Signed)
Subjective:    Patient ID: Deborah Rosales, female    DOB: 09/13/1971, 44 y.o.   MRN: [id number]  HPI Pt returns for f/u of postsurgical hypothyroidism (in 2009, pt had left thyroid lobectomy for a goiter. No malignancy was found.  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  She says it showed only the remaining right right lobe.  She is not considering a pregnancy).  She is back on synthroid x 1 month.   Past Medical History  Diagnosis Date  . Allergy   . Osteopenia   . Thyroid disease   . Hypothyroidism   . Lupus   . 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 Snoring 05/04/2014  . UARS (upper airway resistance syndrome) 05/15/2014  . Hypoxemia 05/15/2014    Past Surgical History  Procedure Laterality Date  . Thyroidectomy Left 2009    Social History   Social History  . Marital Status: Single    Spouse Name: N/A  . Number of Children: 0  . Years of Education: Masters   Occupational History  .     Social History Main Topics  . Smoking status: Never Smoker   . Smokeless tobacco: Never Used  . Alcohol Use: Yes     Comment: occas.  . Drug Use: No  . Sexual Activity: No   Other Topics Concern  . Not on file   Social History Narrative   Patient is single and lives alone.   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   Patient is left-handed.   Patient drinks three cups of coffee per week and two cups of soda per week.    Current Outpatient Prescriptions on File Prior to Visit  Medication Sig Dispense Refill  . levothyroxine (SYNTHROID, LEVOTHROID) 150 MCG tablet Take 1 tablet (150 mcg total) by mouth daily before breakfast. 365 tablet 0   No current facility-administered medications on file prior to visit.    Allergies  Allergen Reactions  . Influenza Vaccines Hives    Family History  Problem Relation Age of Onset  . Hyperlipidemia Mother     . Hypertension Mother   . Hypertension Father   . Hypertension Brother   . Mental retardation Sister   . Hypertension Maternal Grandmother   . Stroke Maternal Grandmother   . Hypertension Maternal Grandfather   . Hypertension Paternal Grandmother   . Heart disease Paternal Grandmother   . Hypertension Paternal Grandfather   . Glaucoma Maternal Grandfather   . Diabetes Paternal Grandmother   . Thyroid disease Maternal Grandmother     BP 136/84 mmHg  Pulse 96  Temp(Src) 97.9 F (36.6 C) (Oral)  Ht 5\' 11"  (1.803 m)  Wt 245 lb (111.131 kg)  BMI 34.19 kg/m2  SpO2 95%    Review of Systems No weight change    Objective:   Physical Exam VITAL SIGNS:  See vs page GENERAL: no distress Neck: a healed scar is present.  i do not appreciate a nodule in the thyroid or elsewhere in the neck.     outside test results are reviewed (04/11/15, off synthroid):  TSH=4 ALT=44 Lab Results  Component Value Date   TSH 0.362 05/28/2015      Assessment & Plan:  Hypothyroidism, well-replaced Elevated hepatic transaminases, new.  We'll check hepatitis studies  Patient is advised the following: Patient Instructions  blood tests are  requested for you today.  We'll let you know about the results.   Please return in 1 year.   Please bring [place] a copy of the 2014 ultrasound report, if you can.

## 2015-05-29 LAB — HEPATITIS B SURFACE ANTIGEN: Hepatitis B Surface Ag: NEGATIVE

## 2015-05-29 LAB — TSH: TSH: 0.362 u[IU]/mL (ref 0.350–4.500)

## 2015-05-29 LAB — HEPATITIS C ANTIBODY: HCV Ab: NEGATIVE

## 2015-07-14 IMAGING — CT CT ABD-PELV W/O CM
2 of 4 series · 16 of 46 positions shown, 18 images · non-contrast
Comparison: None.

CLINICAL DATA: Left flank pain.

EXAM:
CT ABDOMEN AND PELVIS WITHOUT CONTRAST
TECHNIQUE: Multidetector CT imaging of the abdomen and pelvis was performed
following the standard protocol without IV contrast.

[Series 2: standard/full over (age)lbs 5.0 · axial · 0.79mm/px · z∈[-538,-58]mm · 13 of 104 slices shown, 15 images]
[im 4/104  soft-tissue]
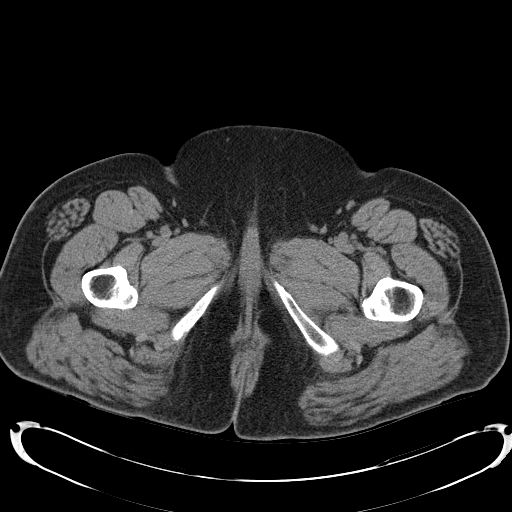
[im 4/104  bone]
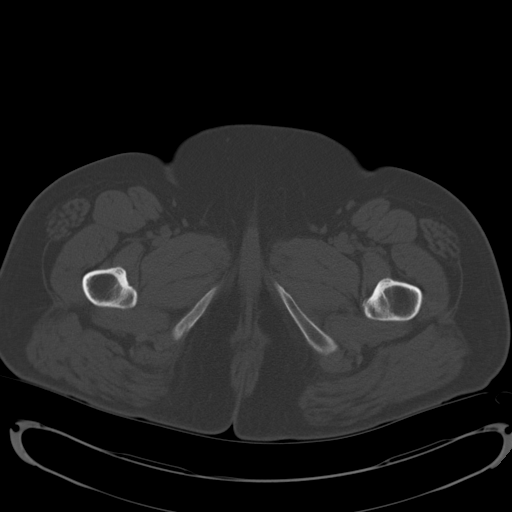
[im 12/104  soft-tissue]
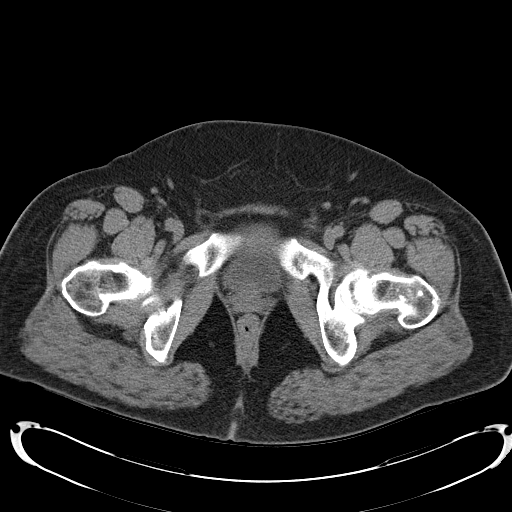
[im 20/104  soft-tissue]
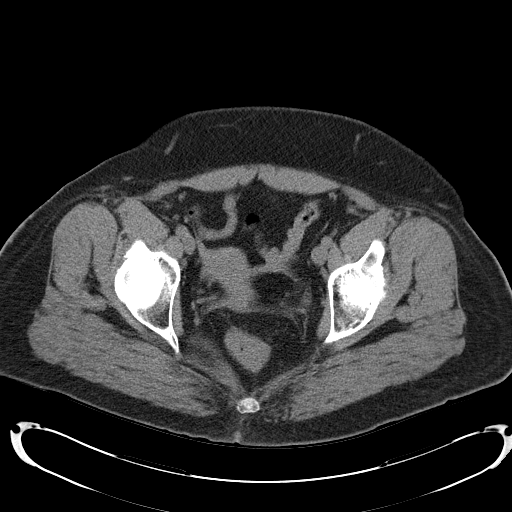
[im 28/104  soft-tissue]
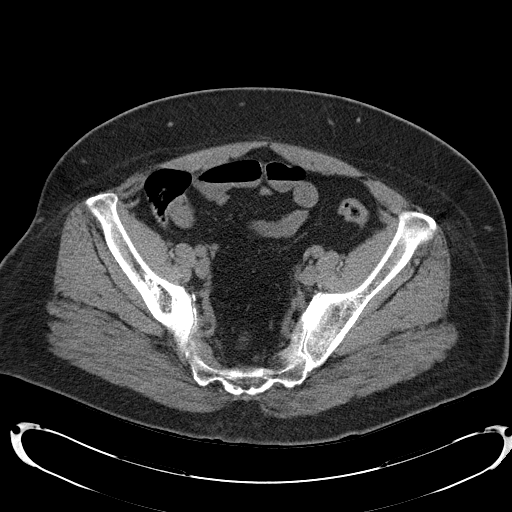
[im 36/104  soft-tissue]
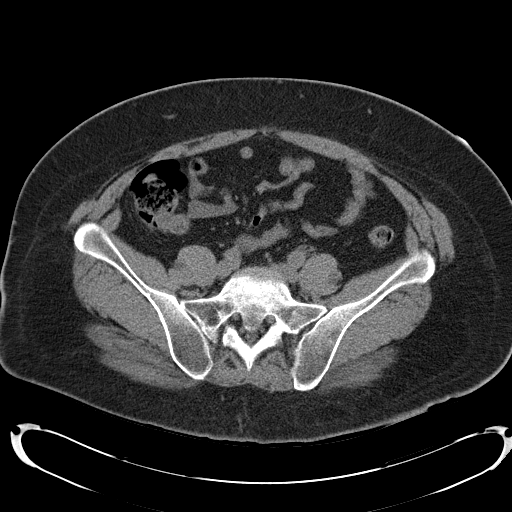
[im 44/104  soft-tissue]
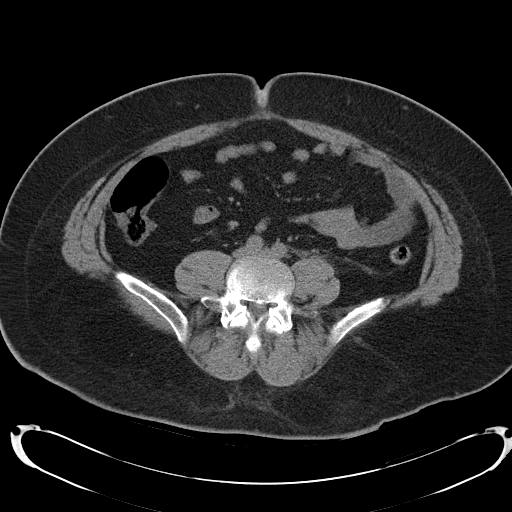
[im 52/104  soft-tissue]
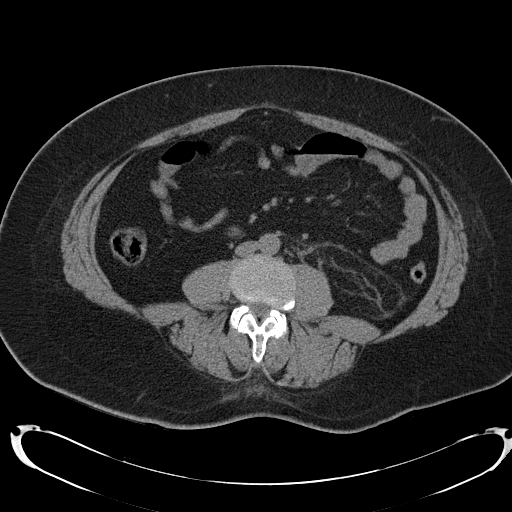
[im 60/104  soft-tissue]
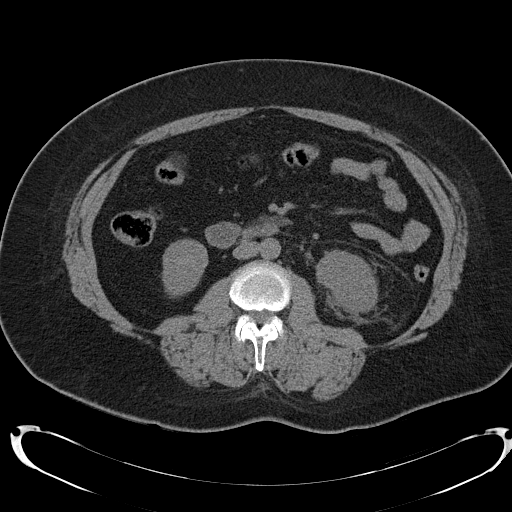
[im 68/104  soft-tissue]
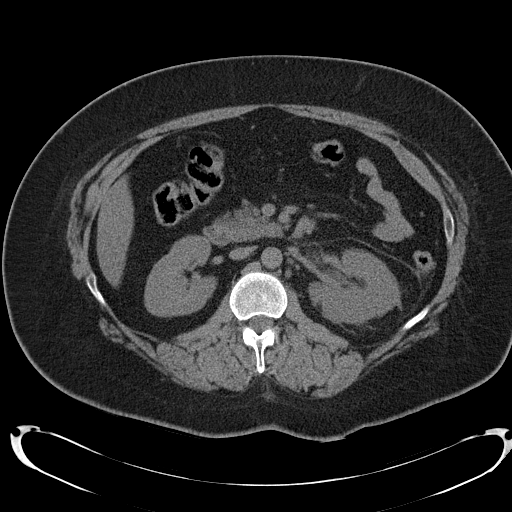
[im 68/104  bone]
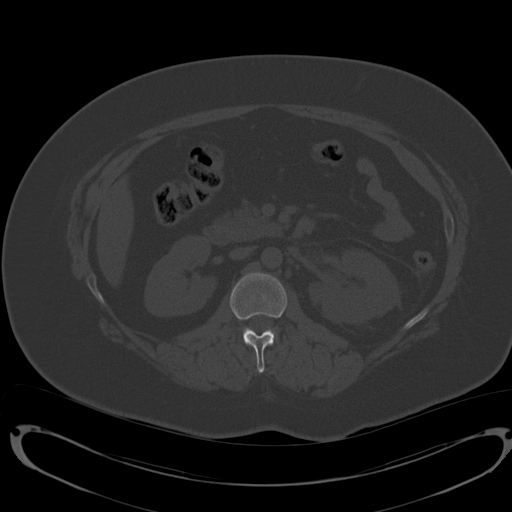
[im 76/104  soft-tissue]
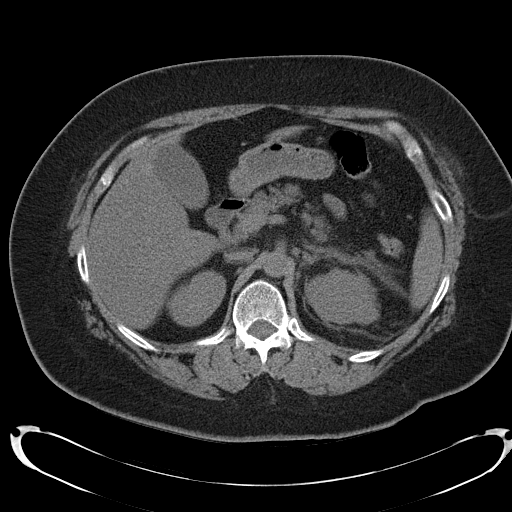
[im 84/104  soft-tissue]
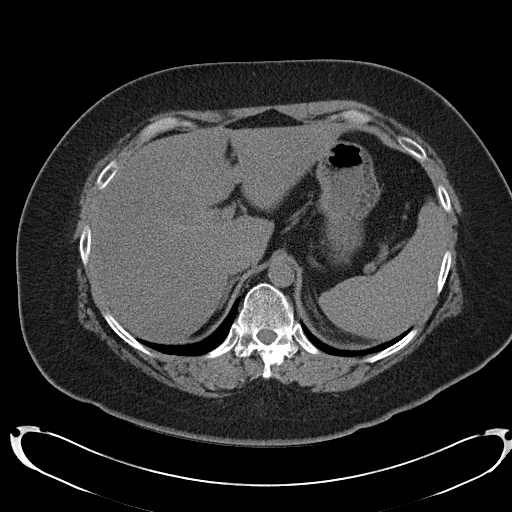
[im 92/104  soft-tissue]
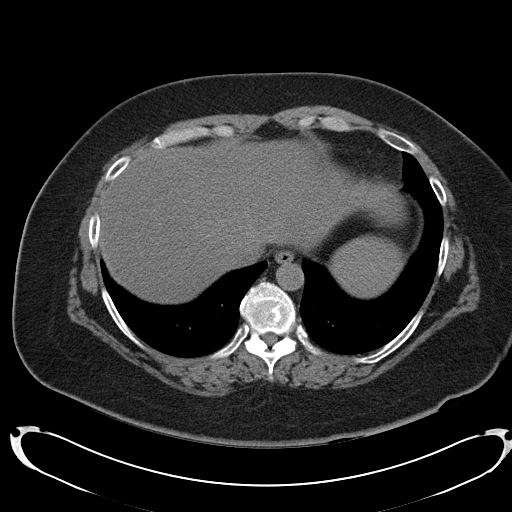
[im 100/104  soft-tissue]
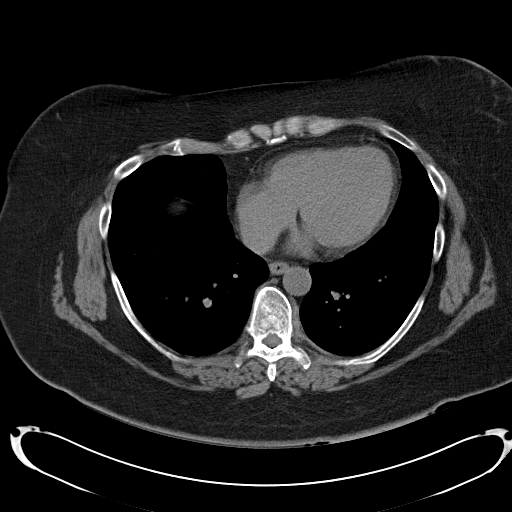

[Series 3: mpr coronal · coronal · 0.77mm/px · 3 of 102 slices shown]
[im 34/102  soft-tissue]
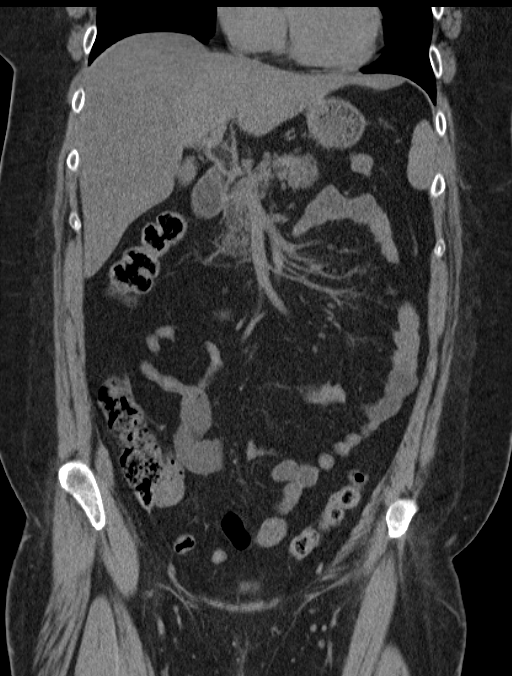
[im 45/102  soft-tissue]
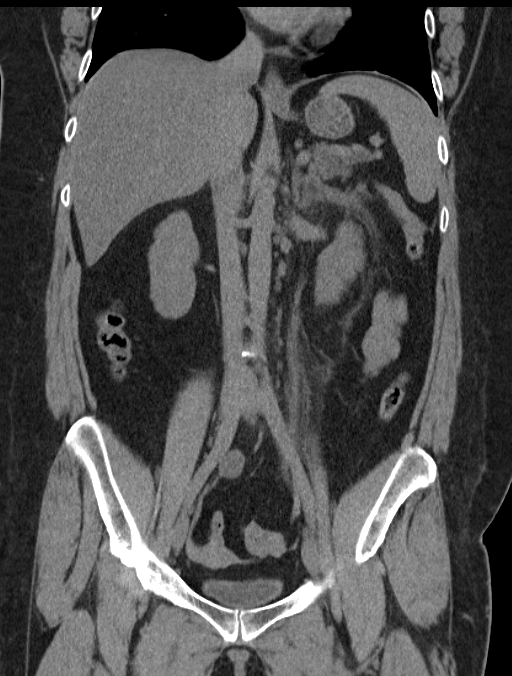
[im 57/102  soft-tissue]
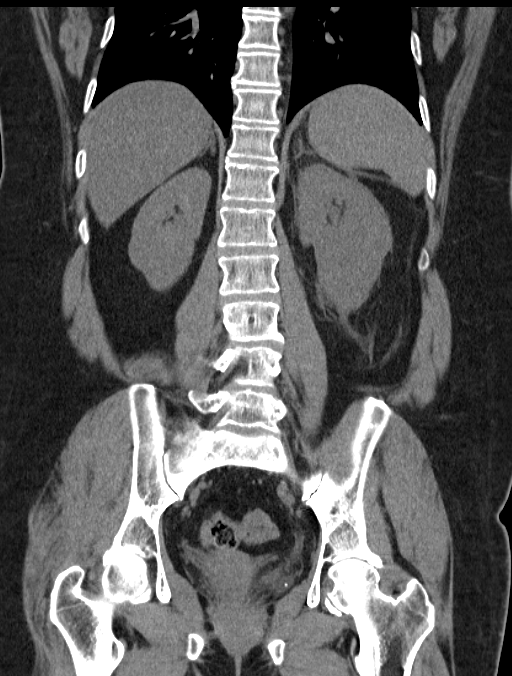

[16 of 46 positions shown; findings below may reference images not displayed]

FINDINGS: The visualized lung bases are clear.

There is diffuse fatty infiltration within the liver. Mild sparing
is noted about the gallbladder fossa. The spleen is unremarkable in
appearance. The gallbladder is within normal limits. The pancreas
and adrenal glands are unremarkable.

Diffuse asymmetric left-sided perinephric stranding is noted, with
trace fluid about Gerota's fascia. No renal or ureteral stones are
seen. The left ureter remains normal in caliber, though soft tissue
inflammation surrounds the course of the left ureter. There is
question of a 3 mm stone on the left side of the base of the
bladder. Findings could reflect a recently passed stone, though
pyelonephritis cannot be excluded.

There is no evidence of hydronephrosis. The right kidney is
unremarkable in appearance.

The small bowel is unremarkable in appearance. The stomach is within
normal limits. No acute vascular abnormalities are seen.

The appendix is normal in caliber and contains trace air, without
evidence for appendicitis. The colon is relatively decompressed and
unremarkable.

The bladder is mildly distended and otherwise grossly unremarkable.
The uterus is within normal limits. The ovaries are relatively
symmetric. No suspicious adnexal masses are seen. No inguinal
lymphadenopathy is seen.

No acute osseous abnormalities are identified.
IMPRESSION: 1. Diffuse asymmetric left-sided perinephric stranding and trace
fluid about Gerota's fascia. No evidence of hydronephrosis. Question
of a 3 mm stone on the left side of the base of bladder. This could
reflect a recently passed left ureteral stone, though left-sided
pyelonephritis cannot be excluded. Would suggest clinical
correlation.
2. Diffuse fatty infiltration within the liver.

## 2016-03-24 ENCOUNTER — Encounter: Payer: Self-pay | Admitting: Neurology

## 2016-04-17 ENCOUNTER — Ambulatory Visit: Payer: PRIVATE HEALTH INSURANCE | Admitting: Neurology

## 2016-04-30 ENCOUNTER — Ambulatory Visit (INDEPENDENT_AMBULATORY_CARE_PROVIDER_SITE_OTHER): Payer: PRIVATE HEALTH INSURANCE | Admitting: Neurology

## 2016-04-30 ENCOUNTER — Encounter: Payer: Self-pay | Admitting: Neurology

## 2016-04-30 VITALS — BP 136/78 | HR 88 | Resp 20 | Ht 72.0 in | Wt 245.0 lb

## 2016-04-30 DIAGNOSIS — G4733 Obstructive sleep apnea (adult) (pediatric): Secondary | ICD-10-CM

## 2016-04-30 DIAGNOSIS — Z9989 Dependence on other enabling machines and devices: Principal | ICD-10-CM

## 2016-04-30 NOTE — Progress Notes (Signed)
[facility]  Provider:  Melvyn Novas, M D  Referring Provider: No ref. provider found Primary Care Physician:     OSA evaluation:   HPI:  Deborah Rosales is a 45 y.o., left handed  female, who is seen here as a referral  from Dr. Thornton Papas  for CPAP compliance.   Interval history :Mr. Mcwhirter had been evaluated for the possibility of sleep apnea based and on a constant of snoring and daytime fatigue,  as well as morning headaches. The evaluation took place in January 2013 in [place], She was first evaluated in [place] in a specialty clinic for Ear,  Nose and Throat physicians.  A home sleep test was performed and showed that but she had some desaturations ( oxygen levels) she seemed not to have frank apneas.  The study concluded that the data collection time was too short to give a valid reading. The recorded data encompassed 10 hours and 10 minutes but the validity of the recording state that only 10 minutes per day to that were usable  She also underwent a nocturnal pulse oximetry which showed her pulse rate to be +/-15 around 60 beats per minute. The study gave no evidence of frank apneas. Soon after , she moved to another city, [place] and had been scheduled at the [facility] for a repeat this time in the lab study that she could finally not attend.  She's here today to see 2-1/2 years later if there is by no evidence of sleep apnea, evidence of upper airway resistance he, will just simply snoring.  In addition, her complaint of morning headaches after waking up has increased in intensity and frequency.  She is also considered extremely daytime sleepy. She has gained weight over the last 2-1/2 years one of her concerns is a possibility of hypoventilation and CO2 retention.   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 In, she may read to 1 AM before she finally falls asleep. She also doesn't have a set rise time in summer. She has however noticed that she wakes up with headaches in the morning but is not awoken by headaches. She I was falls asleep promptly, and will sleep through the night unfortunately she will rely on more than one alarm to be able to rise in the morning. She does not wake up spontaneously. She often feels that she would like another couple of hours of sleep in the morning and feels neither refreshed nor restored. She doesn 't recall any dreams.  The patient often naps in the afternoon even after 8-10 hours of nocturnal sleep. The problem here as a gun but she does not wake up from a nap easily and that she sometimes falls asleep in the late afternoon and her now just leads into a nocturnal sleep. When she wakes up , she has a generalized headache, throbbing , retreating over the early hours of the day.   She drinks caffeine , but not excessively- one cup in AM . No energy drinks. No SODA, no iced tea. On weekends she likes to drink Beer.  She is  physically active , teaches and takes Zumba classes. She does not use tobacco in any form,   05-07-14  PSG performed : The sleep study revealed mild apnea , 46 torr of CO2 retention.  But the patient had  low oxygen levels (nadir 79%)  for a rather prolonged period of time. Her REM sleep dependent AHI was 11.7 RDI was 15.2 , supine  AHI was 16.4. 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 She needs to avoid supine sleep, as  only when sleeping on her back is apnea evident. We discussed the Tennisball method. She will resume weight watchers. She still has headaches in AM, hypnic headaches.  CPAP will allow for better  hypoxemia control. CPAP to be set auto 5-12 cm water.  CD   Interval history from 05-14-15. Deborah Rosales is here for her yearly revisit she came all the way from [place]. I and had a very mild form of apnea more accentuated upper airway resistance he syndrome and tolerate CPAP now very well. Her compliance is excellent 6 hours and 37 minutes on average at night 87% compliance for over 4 hours of consecutive use. She continues to use the multiple set and the AHI is 0.8. She has increased her exercise regimen but she has been frustrated that she hasn't lost weight in the meantime her Epworth sleepiness score is still mildly elevated at 12 points and her fatigue severity score is 40 points however these numbers are comparable to the last visit she was prior to using CPAP endorsing the Epworth score at 16 points.  Since she has a height of 6 foot 1 ", her BMI is 32.2.  She is not on any new medications.   Interval history from 04/30/2016, the patient has a 83% compliance for her CPAP use she's using an AutoSet between 5 and 12 cm water pressure with 3 cm EPR. Residual AHI is excellent 0.7 the 95th percentile pressure is 10 cm water. No adjustment has to be made there are no central apneas emerging. I would like for her to try to add another 20 or 30 minutes each night to her CPAP use. Average user time right now is 5 hours and 30 minutes. Epworth sleepiness score was today endorsed at 12 points fatigue severity was not endorsed. The patient reports less frequent hypnic headaches and overall appears more alert and is physically still very active. She resides in [place] and is back home here for the summer in the [place]. I would offer her from now on a once a year revisit during her summer break.    Review of Systems: Out of a complete 14 system review, the patient complains of only the following symptoms, and all other reviewed systems are negative.The patient endorsed the Epworth sleepiness score at 15 from 12 points  and fatigue severity at  40 from 46 points. 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     Social History   Social History  . Marital Status: Single    Spouse Name: N/A  . Number of Children: 0  . Years of Education: Masters   Occupational History  .     Social History Main Topics  . Smoking status: Never Smoker   . Smokeless tobacco: Never Used  . Alcohol Use: Yes     Comment: occas.  . Drug Use: No  . Sexual Activity: No   Other Topics Concern  . Not on file   Social History Narrative   Patient is single  and lives alone.   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   Patient has a Master's degree.   Patient is left-handed.   Patient drinks three cups of coffee per week and two cups of soda per week.    Family History  Problem Relation Age of Onset  . Hyperlipidemia Mother   . Hypertension Mother   . Hypertension Father   . Hypertension Brother   . Mental retardation Sister   . Hypertension Maternal Grandmother   . Stroke Maternal Grandmother   . Hypertension Maternal Grandfather   . Hypertension Paternal Grandmother   . Heart disease Paternal Grandmother   . Hypertension Paternal Grandfather   . Glaucoma Maternal Grandfather   . Diabetes Paternal Grandmother   . Thyroid disease Maternal Grandmother     Past Medical History  Diagnosis Date  . Allergy   . Osteopenia   . Thyroid disease   . Hypothyroidism   . Lupus (HCC)   . 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 Snoring 05/04/2014  . UARS (upper airway resistance syndrome) 05/15/2014  . Hypoxemia 05/15/2014    Past Surgical History  Procedure Laterality Date  . Thyroidectomy Left 2009    Current Outpatient Prescriptions  Medication Sig Dispense Refill  . Cholecalciferol (VITAMIN D3) 10000 units capsule Take 10,000 Units by mouth daily.    Marland Kitchen. estradiol-norethindrone (COMBIPATCH) 0.05-0.14 MG/DAY Place 1 patch onto the skin 2 (two) times a week.    . Thyroid (NATURE-THROID) 113.75  MG TABS Take by mouth.     No current facility-administered medications for this visit.    Allergies as of 04/30/2016 - Review Complete 04/30/2016  Allergen Reaction Noted  . Influenza vaccines Hives 05/25/2014    Vitals: BP 136/78 mmHg  Pulse 88  Resp 20  Ht 6' (1.829 m)  Wt 245 lb (111.131 kg)  BMI 33.22 kg/m2 Last Weight:  Wt Readings from Last 1 Encounters:  04/30/16 245 lb (111.131 kg)   Last Height:   Ht Readings from Last 1 Encounters:  04/30/16 6' (1.829 m)    Physical exam:  General: The patient is awake, alert and appears not in acute distress. The patient is well groomed. Head: Normocephalic, atraumatic. Neck is supple. Mallampati 4 , neck circumference:  17. 25 inches. No nasal septal deviation, no TMJ.  Habitual mouth breather.   Cardiovascular:  Regular rate and rhythm, without  murmurs or carotid bruit, and without distended neck veins. Respiratory: Lungs are clear to auscultation. Skin:  Without evidence of edema, or rash Trunk: BMI is  elevated /patient has normal posture.  Neurologic exam : The patient is awake and alert, oriented to place and time.  Memory subjective described as intact.  There is a normal attention span & concentration ability. Speech is fluent without  dysarthria, dysphonia or aphasia.  Cranial nerves: Pupils are equal and briskly reactive to light. Hearing to finger rub intact.  Facial sensation intact to fine touch. Facial motor strength is symmetric and tongue and uvula move midline. Tongue protrusion into both cheeks was strong. No abnormal shoulder shrug.  Motor exam:  Normal tone , muscle bulk and symmetric normal strength in all extremities..  Stance is stable and normal.  Deep tendon reflexes: in the  upper and lower extremities are symmetric and intact.   Assessment:  After physical and neurologic examination, review of laboratory studies, imaging, neurophysiology testing and pre-existing records, 15 minute assessment is   1) OSA with UARS, on CPAP   2) hypothyroidism,stabile   3)orthopnea- much  improved .  4) obesity, at least partially related to thyroid dysfunction ? .  5) resolved nocturia.    Plan:  Treatment plan and additional workup : continue CPAP  treatment : Rv in 12 month.  Cc Dr.Tomlin ,  MD [facility].

## 2017-04-28 ENCOUNTER — Encounter: Payer: Self-pay | Admitting: Neurology

## 2017-04-30 ENCOUNTER — Ambulatory Visit (INDEPENDENT_AMBULATORY_CARE_PROVIDER_SITE_OTHER): Payer: PRIVATE HEALTH INSURANCE | Admitting: Neurology

## 2017-04-30 ENCOUNTER — Encounter: Payer: Self-pay | Admitting: Neurology

## 2017-04-30 VITALS — BP 136/84 | HR 60 | Ht 71.0 in | Wt 250.0 lb

## 2017-04-30 DIAGNOSIS — Z9989 Dependence on other enabling machines and devices: Secondary | ICD-10-CM

## 2017-04-30 DIAGNOSIS — G4733 Obstructive sleep apnea (adult) (pediatric): Secondary | ICD-10-CM | POA: Diagnosis not present

## 2017-04-30 NOTE — Progress Notes (Signed)
[facility]  Provider:  Melvyn Novasarmen  Jani Rosales, M D  Referring Provider: No ref. provider found Primary Care Physician:       HPI: interval history from 04/30/2017, I have pleasure of seeing [name] today who has been calm using between 6070 and the [place] for quite a while now. She has been compliant with her CPAP, but used another device for 6 days was in the last 30 days as she traveled. Her average user time on her main CPAP of 6 hours and 11 minutes. A residual AHI is only 1.6 per hour and there are no central apneas emerging under CPAP treatment. She does have some moderate air leaks, but her pressures maintained very well. My goal is to introduce her to a mask that may be more tolerable and less prone to air leakage. She has been using a nasal pillow. She reports an air-leak between tubing and mask.  With CPAP she has no nocturia and no headaches, no daytime sleepiness.       Deborah Rosales is a 46 y.o., left handed  female, who is seen here as a referral  from Dr. Thornton PapasJeffrey Rosales  for CPAP compliance.  Mr. Deborah Rosales had been evaluated for the possibility of sleep apnea based and on a constant of snoring and daytime fatigue,  as well as morning headaches. The evaluation took place in January 2013 in [place], She was first evaluated in [place] in a specialty clinic , by Ear, Nose and Throat physicians.  A home sleep test was performed and showed that but she had some desaturations ( oxygen levels) she seemed not to have frank apneas.  The study concluded that the data collection time was too short to give a valid reading. The recorded data encompassed 10 hours and 10 minutes but the validity of the recording state that only 10 minutes per day to that were usable She also underwent a nocturnal pulse oximetry which showed her pulse rate to be +/-15 around 60 beats per minute. The study gave no evidence of frank apneas. Soon after , she moved to another city,  [place] and had been scheduled [facility] for a repeat this time in the lab study that she could finally not attend.  She's here today to see 2-1/2 years later if there is by no evidence of sleep apnea, evidence of upper airway resistance he, will just simply snoring.  In addition, her complaint of morning headaches after waking up has increased in intensity and frequency.  She is also considered extremely daytime sleepy. She has gained weight over the last 2-1/2 years one of her concerns is a possibility of hypoventilation and CO2 retention.   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 In, she may read to 1 AM before she finally falls asleep. She also doesn't have a set rise time in summer. She has however noticed that she wakes up with headaches in the morning but is not awoken by headaches. She I was falls asleep promptly, and will sleep through the night unfortunately she will rely on more than one alarm to be able to rise in the morning. She does not wake up spontaneously. She often feels that she would like another couple of hours of sleep in the morning and feels neither refreshed nor restored. She doesn 't recall  any dreams.  The patient often naps in the afternoon even after 8-10 hours of nocturnal sleep.  she does not wake up from a nap easily and that she sometimes falls asleep in the late afternoon When she wakes up , she has a generalized headache, throbbing , retreating over the early hours of the day.   She drinks caffeine , but not excessively- one cup in AM . No energy drinks. No SODA, no iced tea. On weekends she likes to drink Beer.  She is  physically active , teaches and takes Zumba classes. She does not use tobacco in any form,   05-07-14  PSG performed : The sleep study revealed mild apnea 11. 7  , 46 torr of CO2 retention.  But the  patient had low oxygen levels (nadir 79%)  for a rather prolonged period of time. Her REM sleep dependent AHI was 11.7 RDI was 15.2 , supine  AHI was 16.4. 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 She needs to avoid supine sleep, as  only when sleeping on her back is apnea evident. We discussed the Tennisball method. She will resume weight watchers. She still has headaches in AM, hypnic headaches.  CPAP will allow for better hypoxemia control. CPAP to be set auto 5-12 cm water.  CD  Interval history from 05-14-15. [name] is here for her yearly revisit she came all the way from [place]. I and had a very mild form of apnea more accentuated upper airway resistance he syndrome and tolerate CPAP now very well. Her compliance is excellent 6 hours and 37 minutes on average at night 87% compliance for over 4 hours of consecutive use. She continues to use the multiple set and the AHI is 0.8. She has increased her exercise regimen but she has been frustrated that she hasn't lost weight in the meantime her Epworth sleepiness score is still mildly elevated at 12 points and her fatigue severity score is 40 points however these numbers are comparable to the last visit she was prior to using CPAP endorsing the Epworth score at 16 points.  Since she has a height of 6 foot 1 ", her BMI is 32.2.    Interval history from 04/30/2016, the patient has a 83% compliance for her CPAP use she's using an AutoSet between 5 and 12 cm water pressure with 3 cm EPR. Residual AHI is excellent 0.7 the 95th percentile pressure is 10 cm water. No adjustment has to be made there are no central apneas emerging. I would like for her to try to add another 20 or 30 minutes each night to her CPAP use. Average user  time right now is 5 hours and 30 minutes. Epworth sleepiness score was today endorsed at 12 points fatigue severity was not endorsed. The patient reports less frequent hypnic headaches and overall appears more alert and is physically still very active. She resides in [place] and is back home here for the summer in the [place]. I would offer her from now on a once a year revisit during her summer break.    Review of Systems: Out of a complete 14 system review, the patient complains of only the following symptoms, and all other reviewed systems are negative.  No longer nocturia but still  EDS-  AHI has slightly increased.  The patient endorsed the Epworth sleepiness score at 15 points and fatigue severity at 31 points. NO dreams, no cataplexy and no hallucinations or sleep paralysis.  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     Social History   Social History  . Marital status: Single    Spouse name: N/A  . Number of children: 0  . Years of education: Masters   Occupational History  .  [employer]    Social History Main Topics  . Smoking status: Never Smoker  . Smokeless tobacco: Never Used  . Alcohol use Yes     Comment: occas.  . Drug use: No  . Sexual activity: No   Other Topics Concern  . Not on file   Social History Narrative   Patient is single and lives alone.   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   Patient is left-handed.   Patient drinks three cups of coffee per week and two cups of soda per week.    Family History  Problem Relation Age of Onset  . Hyperlipidemia Mother   . Hypertension Mother   . Hypertension Father   . Hypertension Brother   . Mental retardation Sister   . Hypertension Maternal Grandmother   . Stroke Maternal Grandmother   . Hypertension Maternal Grandfather   . Hypertension Paternal Grandmother   . Heart disease Paternal Grandmother   . Hypertension  Paternal Grandfather   . Glaucoma Maternal Grandfather   . Diabetes Paternal Grandmother   . Thyroid disease Maternal Grandmother     Past Medical History:  Diagnosis Date  . Allergy   . 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 Hypothyroidism   . Hypoxemia 05/15/2014  . Lupus   . Osteopenia   . Snoring 05/04/2014  . Thyroid disease   . UARS (upper airway resistance syndrome) 05/15/2014    Past Surgical History:  Procedure Laterality Date  . THYROIDECTOMY Left 2009    Current Outpatient Prescriptions  Medication Sig Dispense Refill  . Cholecalciferol (VITAMIN D3) 10000 units capsule Take 10,000 Units by mouth daily.    Marland Kitchen estradiol-norethindrone (COMBIPATCH) 0.05-0.14 MG/DAY Place 1 patch onto the skin 2 (two) times a week.    . Thyroid (NATURE-THROID) 113.75 MG TABS Take by mouth.     No current facility-administered medications for this visit.     Allergies as of 04/30/2017 - Review Complete 04/30/2017  Allergen Reaction Noted  . Influenza vaccines Hives 05/25/2014    Vitals: BP 136/84   Pulse 60   Ht 5\' 11"  (1.803 m)   Wt 250 lb (113.4 kg)   BMI 34.87 kg/m  Last Weight:  Wt Readings from Last 1 Encounters:  04/30/17 250 lb (113.4 kg)   Last Height:   Ht Readings from Last 1 Encounters:  04/30/17 5\' 11"  (1.803 m)    Physical exam:  General: The patient is awake, alert and appears not in acute distress. The patient is well groomed. Head: Normocephalic, atraumatic. Neck is supple. Mallampati 4 , neck circumference:  17. 25 inches. No nasal septal deviation, no TMJ.  Habitual mouth breather.   Cardiovascular:  Regular rate and rhythm, without  murmurs or carotid bruit, and without distended neck veins. Respiratory: Lungs are clear to auscultation. Skin:  Without evidence of edema, or rash Trunk: BMI is  elevated /patient has normal posture.  Neurologic exam : The patient is awake  and alert, oriented to place and time.  Memory subjective described as intact.   There is a normal attention span & concentration ability. Speech is fluent without  dysarthria, dysphonia or aphasia.  Cranial nerves:  taste or smell intact ( she loves Schnitzel ) Pupils are equal and briskly reactive to light. Hearing to finger rub intact.  Facial sensation intact to fine touch. Facial motor strength is symmetric and tongue and uvula move midline. Tongue protrusion into both cheeks was strong. No abnormal shoulder shrug.  Motor exam:  Normal tone , muscle bulk and symmetric normal strength in all extremities..  Stance is stable and normal.  Deep tendon reflexes: in the  upper and lower extremities are symmetric and intact.   Assessment:  After physical and neurologic examination, review of laboratory studies, imaging, neurophysiology testing and pre-existing records,  I refitted the patient personally with a nasal mas to try out at home, a WISP in XL>  20 minute assessment is  1) OSA with UARS, on CPAP- high compliance but residual EDS is still noted.   I asked the patient to try a WISP Respironics XL  nasal mask, this may give her more stability it will be more difficult to dislodge this mask at night. Otherwise I have no suggestions for change.   Plan:  Treatment plan and additional workup : continue CPAP  treatment : Rv in 12 month.   Deborah Novas, MD   Cc Dr.Tomlin ,  MD [facility].

## 2017-04-30 NOTE — Patient Instructions (Signed)

## 2018-04-28 ENCOUNTER — Encounter: Payer: Self-pay | Admitting: Neurology

## 2018-05-03 ENCOUNTER — Ambulatory Visit (INDEPENDENT_AMBULATORY_CARE_PROVIDER_SITE_OTHER): Payer: PRIVATE HEALTH INSURANCE | Admitting: Neurology

## 2018-05-03 ENCOUNTER — Encounter: Payer: Self-pay | Admitting: Neurology

## 2018-05-03 VITALS — BP 135/85 | HR 64 | Ht 72.0 in | Wt 249.0 lb

## 2018-05-03 DIAGNOSIS — Z9989 Dependence on other enabling machines and devices: Secondary | ICD-10-CM

## 2018-05-03 DIAGNOSIS — G4733 Obstructive sleep apnea (adult) (pediatric): Secondary | ICD-10-CM

## 2018-05-03 NOTE — Progress Notes (Signed)
[facility]  Provider:  Melvyn Novas, M D  Referring Provider: Primary Care Physician:  Dr Deborah Rosales.  I have the pleasure of meeting [name] today and meanwhile 47 year old female patient who has been established with CPAP care.  Today 03 May 2018 the patient has used her machine 80% of days and 67% for time with an average of 5 hours and 3 minutes total use per night the average usage on days when she uses her machine is 6 hours 18 minutes.  She is using an AutoSet between 5 and 12 cmH2O pressure with 3 cm EPR and a residual AHI of 1.5 results.  This is a good control of her apnea.  The 95th percentile pressure is 8.7.  I asked the patient to use her CPAP machine even when she naps and to put a reminder next to her think that she will not forget to put the CPAP on.  She has no difficulties with using CPAP, but sometimes simply goes to bed and forgets it.  Her Epworth sleepiness score was endorsed at 10 points fatigue severity at 27 points, she does not appear depressed.  I would like to follow yearly from their own. She works in [place]. Travelling  CPAP request.    HPI: interval history from 04/30/2017, I have pleasure of seeing [name] today who has been calm using between 47 and the [place] for quite a while now. She has been compliant with her CPAP, but used another device for 6 days was in the last 30 days as she traveled. Her average user time on her main CPAP of 6 hours and 11 minutes. A residual AHI is only 1.6 per hour and there are no central apneas emerging under CPAP treatment. She does have some moderate air leaks, but her pressures maintained very well. My goal is to introduce her to a mask that may be more tolerable and less prone to air leakage. She has been using a nasal pillow. She reports an air-leak between tubing and mask.  With CPAP she has no nocturia and no headaches, no daytime sleepiness.       Deborah Rosales is a 47 y.o.,  left handed  female, who is seen here as a referral  from Dr. Thornton Rosales  for CPAP compliance.  Mr. Deborah Rosales had been evaluated for the possibility of sleep apnea based and on a constant of snoring and daytime fatigue,  as well as morning headaches. The evaluation took place in January 2013 in [place], She was first evaluated in [place] in a specialty clinic , by Ear, Nose and Throat physicians.  A home sleep test was performed and showed that but she had some desaturations ( oxygen levels) she seemed not to have frank apneas.  The study concluded that the data collection time was too short to give a valid reading. The recorded data encompassed 10 hours and 10 minutes but the validity of the recording state that only 10 minutes per day to that were usable She also underwent a nocturnal pulse oximetry which showed her pulse rate to be +/-15 around 60 beats per minute. The study gave no evidence of frank apneas. Soon after , she moved to another city, [place] and had been scheduled at the [facility] for a repeat this time in the lab study that she could finally not attend.  She's here today to see 2-1/2 years later if there is by no evidence of sleep  apnea, evidence of upper airway resistance he, will just simply snoring.  In addition, her complaint of morning headaches after waking up has increased in intensity and frequency.  She is also considered extremely daytime sleepy. She has gained weight over the last 2-1/2 years one of her concerns is a possibility of hypoventilation and CO2 retention.   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 In, she may read to 1 AM before she finally falls asleep. She also doesn't have a set rise time in summer. She has however noticed that she wakes up with headaches in the morning but is not awoken by headaches. She I was falls  asleep promptly, and will sleep through the night unfortunately she will rely on more than one alarm to be able to rise in the morning. She does not wake up spontaneously. She often feels that she would like another couple of hours of sleep in the morning and feels neither refreshed nor restored. She doesn 't recall any dreams.  The patient often naps in the afternoon even after 8-10 hours of nocturnal sleep.  she does not wake up from a nap easily and that she sometimes falls asleep in the late afternoon When she wakes up , she has a generalized headache, throbbing , retreating over the early hours of the day.   She drinks caffeine , but not excessively- one cup in AM . No energy drinks. No SODA, no iced tea. On weekends she likes to drink Beer.  She is  physically active , teaches and takes Zumba classes. She does not use tobacco in any form,    05-07-14  PSG performed : The sleep study revealed mild apnea 11. 7  , 46 torr of CO2 retention.  But the patient had low oxygen levels (nadir 79%)  for a rather prolonged period of time. Her REM sleep dependent AHI was 11.7 RDI was 15.2 , supine  AHI was 16.4. 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 She needs to avoid supine sleep, as  only when sleeping on her back is apnea evident. We discussed the Tennisball method. She will resume weight watchers. She still has headaches in AM, hypnic headaches.  CPAP will allow for better hypoxemia control. CPAP to be set auto 5-12 cm water.  CD  Interval history from 05-14-15. Deborah Rosales is here for her yearly revisit she came all the way from [place]. I and had a very mild form of apnea more accentuated upper airway resistance he syndrome and tolerate CPAP now very well. Her compliance  is excellent 6 hours and 37 minutes on average at night 87% compliance for over 4 hours of consecutive use. She continues to use the multiple set and the AHI is 0.8. She has increased her exercise regimen but she has been frustrated that she hasn't lost weight in the meantime her Epworth sleepiness score is still mildly elevated at 12 points and her fatigue severity score is 40 points however these numbers are comparable to the last visit she was prior to using CPAP endorsing the Epworth score at 16 points.  Since she has a height of  6 foot 1 ", her BMI is 32.2.   Review of Systems: Out of a complete 14 system review, the patient complains of only the following symptoms, and all other reviewed systems are negative.  No longer nocturia. Epworth sleepiness score at 10 points and fatigue severity at 31 points. NO dreams, no cataplexy and no hallucinations or sleep paralysis.  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     Social History   Socioeconomic History  . Marital status: Single    Spouse name: Not on file  . Number of children: 0  . Years of education: Masters  . Highest education level: Not on file  Occupational History    Employer: [employer]   Social Needs  . Financial resource strain: Not on file  . Food insecurity:    Worry: Not on file    Inability: Not on file  . Transportation needs:    Medical: Not on file    Non-medical: Not on file  Tobacco Use  . Smoking status: Never Smoker  . Smokeless tobacco: Never Used  Substance and Sexual Activity  . Alcohol use: Yes    Comment: occas.  . Drug use: No  . Sexual activity: Never  Lifestyle  . Physical activity:    Days per week: Not on file    Minutes per session: Not on file  . Stress: Not on file  Relationships  . Social connections:    Talks on phone: Not on file    Gets together: Not on file    Attends religious service: Not on file    Active member of club  or organization: Not on file    Attends meetings of clubs or organizations: Not on file    Relationship status: Not on file  . Intimate partner violence:    Fear of current or ex partner: Not on file    Emotionally abused: Not on file    Physically abused: Not on file    Forced sexual activity: Not on file  Other Topics Concern  . Not on file  Social History Narrative   Patient is single and lives alone.   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   Patient is left-handed.   Patient drinks three cups of coffee per week and two cups of soda per week.    Family History  Problem Relation Age of Onset  . Hyperlipidemia Mother   . Hypertension Mother   . Hypertension Father   . Hypertension Brother   . Mental retardation Sister   . Hypertension Maternal Grandmother   . Stroke Maternal Grandmother   . Hypertension Maternal Grandfather   . Hypertension Paternal Grandmother   . Heart disease Paternal Grandmother   . Hypertension Paternal Grandfather   . Glaucoma Maternal Grandfather   . Diabetes Paternal Grandmother   . Thyroid disease Maternal Grandmother     Past Medical History:  Diagnosis Date  . Allergy   . 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 Hypothyroidism   . Hypoxemia 05/15/2014  . Lupus (HCC)   . Osteopenia   . Snoring 05/04/2014  . Thyroid disease   . UARS (upper airway resistance syndrome) 05/15/2014    Past Surgical History:  Procedure Laterality Date  . THYROIDECTOMY Left 2009    Current Outpatient Medications  Medication Sig Dispense Refill  . allopurinol (ZYLOPRIM) 100 MG tablet TK 1 T PO BID  0  . Cholecalciferol (VITAMIN  D3) 10000 units capsule Take 10,000 Units by mouth daily.    . Thyroid (NATURE-THROID) 113.75 MG TABS Take by mouth.    . Vitamin D, Ergocalciferol, (DRISDOL) 50000 units CAPS capsule TK ONE C PO ONCE WEEKLY  0   No current facility-administered medications for this visit.     Allergies as of 05/03/2018 -  Review Complete 05/03/2018  Allergen Reaction Noted  . Influenza vaccines Hives 05/25/2014    Vitals: BP 135/85   Pulse 64   Ht 6' (1.829 m)   Wt 249 lb (112.9 kg)   BMI 33.77 kg/m  Last Weight:  Wt Readings from Last 1 Encounters:  05/03/18 249 lb (112.9 kg)   Last Height:   Ht Readings from Last 1 Encounters:  05/03/18 6' (1.829 m)    Physical exam:  General: The patient is awake, alert and appears not in acute distress. The patient is well groomed. Head: Normocephalic, atraumatic. Neck is supple. Mallampati 4 , neck circumference:  17. 25 inches. No nasal septal deviation, no TMJ.  Habitual mouth breather.   Cardiovascular:  Regular rate and rhythm, without  murmurs or carotid bruit, and without distended neck veins. Respiratory: Lungs are clear to auscultation. Skin:  Without evidence of edema, or rash Trunk: BMI is  elevated /patient has normal posture.  Neurologic exam : The patient is awake and alert, oriented to place and time.  Memory subjective described as intact.  There is a normal attention span & concentration ability. Speech is fluent without  dysarthria, dysphonia or aphasia.  Cranial nerves: Pupils are equal and briskly reactive to light. Hearing to finger rub intact.  Facial sensation intact to fine touch. Facial motor strength is symmetric and tongue and uvula move midline. Tongue protrusion into both cheeks was strong.  Motor exam:  Normal tone , muscle bulk and symmetric normal strength in all extremities. Stance is stable and normal.  Deep tendon reflexes: in the upper and lower extremities are symmetric and intact.   Assessment:  After physical and neurologic examination, review of laboratory studies, imaging, neurophysiology testing and pre-existing records,  I refitted the patient personally with a nasal mask to try out at home, a WISP in XL>  20 minute assessment is   1) OSA with UARS, on CPAP- reduced compliance but residual EDS has responded .   I asked the patient to try a WISP Respironics XL  nasal mask, this may give her more stability it will be more difficult to dislodge this mask at night. Otherwise I have no suggestions for change.   Plan:  Treatment plan and additional workup : continue CPAP  treatment : Rv in 12 month.   Melvyn Novas, MD   Cc Dr.Tomlin ,  MD [facility].

## 2018-05-03 NOTE — Patient Instructions (Signed)

## 2019-04-29 ENCOUNTER — Encounter: Payer: Self-pay | Admitting: Neurology

## 2019-05-05 ENCOUNTER — Ambulatory Visit (INDEPENDENT_AMBULATORY_CARE_PROVIDER_SITE_OTHER): Payer: PRIVATE HEALTH INSURANCE | Admitting: Neurology

## 2019-05-05 ENCOUNTER — Encounter: Payer: Self-pay | Admitting: Neurology

## 2019-05-05 ENCOUNTER — Other Ambulatory Visit: Payer: Self-pay

## 2019-05-05 VITALS — BP 127/82 | HR 67 | Temp 97.5°F | Ht 71.0 in | Wt 251.0 lb

## 2019-05-05 DIAGNOSIS — Z9989 Dependence on other enabling machines and devices: Secondary | ICD-10-CM | POA: Diagnosis not present

## 2019-05-05 DIAGNOSIS — G4733 Obstructive sleep apnea (adult) (pediatric): Secondary | ICD-10-CM

## 2019-05-05 NOTE — Progress Notes (Addendum)
[facility]  Provider:  Larey Seat, M D  Referring Provider: Primary Care Physician:  Dr Gertie Fey.    05-05-2019, Interval History ; Deborah Rosales is a 48 y.o., left handed female patient living in [place] and She has developed much better sleep since being treated with an estrogen cream, in AM and progesteron vaginally at night. She feels refreshed and restored.  She continues to use CPAP ; [name] endorsed today the Epworth sleepiness score at 8 points endorsing question 1, 2 and 5 with 2 points each and the third and fourth question was 1.2 each.  Her fatigue severity score is much reduced 26 out of 36 points, and she also has continued to be a highly compliant CPAP user I have the data of the last 30 days with the last night being 29 April 2019 available, she uses the machine on average 6 hours and 2 minutes each night 93% compliance, minimum pressure of 5 maximum pressure of 12 with 3 cm EPR her machine is an air sense 10 AutoSet serial #[id number].  Residual AHI is 1.3 which is a perfect resolution of apnea and is 95th percentile pressure is 9.2 there is no Cheyne-Stokes respiration.  And she does have minimal air leaks.  No changes in settings or interface are needed.    I have the pleasure of meeting Deborah Rosales. Alviar today and meanwhile 48 year old female patient who has been established with CPAP care.   Today 03 May 2018 the patient has used her machine 80% of days and 67% for time with an average of 5 hours and 3 minutes total use per night the average usage on days when she uses her machine is 6 hours 18 minutes.  She is using an AutoSet between 5 and 12 cmH2O pressure with 3 cm EPR and a residual AHI of 1.5 results.  This is a good control of her apnea.  The 95th percentile pressure is 8.7.  I asked the patient to use her CPAP machine even when she naps and to put a reminder next to her think that she will not forget to put the CPAP on.   She has no difficulties with using CPAP, but sometimes simply goes to bed and forgets it.  Her Epworth sleepiness score was endorsed at 10 points fatigue severity at 27 points, she does not appear depressed. I would like to follow yearly from their own. She works in [place]. Travelling  CPAP request.    HPI: interval history from 04/30/2017, I have pleasure of seeing Deborah Rosales today who has been calm using between 75 and the [place] for quite a while now. She has been compliant with her CPAP, but used another device for 6 days was in the last 30 days as she traveled. Her average user time on her main CPAP of 6 hours and 11 minutes. A residual AHI is only 1.6 per hour and there are no central apneas emerging under CPAP treatment. She does have some moderate air leaks, but her pressures maintained very well. My goal is to introduce her to a mask that may be more tolerable and less prone to air leakage. She has been using a nasal pillow. She reports an air-leak between tubing and mask.  With CPAP she has no nocturia and no headaches, no daytime sleepiness.       [name] is a 48 y.o., left handed  female, who is seen here as a  referral  from Dr. Thornton PapasJeffrey Anderson  for CPAP compliance.  Deborah Rosales had been evaluated for the possibility of sleep apnea based and on a constant of snoring and daytime fatigue,  as well as morning headaches. The evaluation took place in January 2013 in [place], [place] was [place] in [place] in a specialty clinic , by Ear, Nose and Throat physicians.  A home sleep test was performed and showed that but she had some desaturations ( oxygen levels) she seemed not to have frank apneas.  The study concluded that the data collection time was too short to give a valid reading. The recorded data encompassed 10 hours and 10 minutes but the validity of the recording state that only 10 minutes per day to that were usable She also underwent a nocturnal pulse oximetry which showed her  pulse rate to be +/-15 around 60 beats per minute. The study gave no evidence of frank apneas. Soon after , [place] moved to another city, [place] and had [facility] repeat this time in the lab study that she could finally not attend.  She's here today to see 2-1/2 years later if there is by no evidence of sleep apnea, evidence of upper airway resistance he, will just simply snoring.  In addition, her complaint of morning headaches after waking up has increased in intensity and frequency.  She is also considered extremely daytime sleepy. She has gained weight over the last 2-1/2 years one of her concerns is a possibility of hypoventilation and CO2 retention.   Mrs. Donavan Rosales is working as an Education officer, museummiddle school teacher in [place] , and states that during the school year she has a regular sleep routine which varies from her summer holiday time. In, she may read to 1 AM before she finally falls asleep. She also doesn't have a set rise time in summer. She has however noticed that she wakes up with headaches in the morning but is not awoken by headaches. She I was falls asleep promptly, and will sleep through the night unfortunately she will rely on more than one alarm to be able to rise in the morning. She does not wake up spontaneously. She often feels that she would like another couple of hours of sleep in the morning and feels neither refreshed nor restored. She doesn 't recall any dreams.  The patient often naps in the afternoon even after 8-10 hours of nocturnal sleep.  she does not wake up from a nap easily and that she sometimes falls asleep in the late afternoon When she wakes up , she has a generalized headache, throbbing , retreating over the early hours of the day.   She drinks caffeine , but not excessively- one cup in AM . No energy drinks. No SODA, no iced tea. On weekends she likes to drink Beer.  She is  physically active , teaches and takes Zumba classes. She does not  use tobacco in any form,    05-07-14  PSG performed : The sleep study revealed mild apnea 11. 7  , 46 torr of CO2 retention.  But the patient had low oxygen levels (nadir 79%)  for a rather prolonged period of time. Her REM sleep dependent AHI was 11.7 RDI was 15.2 , supine  AHI was 16.4. The meeting today to discuss what would be the best treatment option and I would advise for CPAP  treatment : this  mild sleep apnea with upper airway resistance syndrome can be  both treated using positive airway therapy- in addition to pursue aggressive weight-loss strategies. She needs to avoid supine sleep, as  only when sleeping on her back is apnea evident. We discussed the Tennisball method. She will resume weight watchers. She still has headaches in AM, hypnic headaches.  CPAP will allow for better hypoxemia control. CPAP to be set auto 5-12 cm water.  CD  Interval history from 05-14-15. Mrs. Donavan Rosales is here for her yearly revisit she came all the way from [place]. I and had a very mild form of apnea more accentuated upper airway resistance he syndrome and tolerate CPAP now very well. Her compliance is excellent 6 hours and 37 minutes on average at night 87% compliance for over 4 hours of consecutive use. She continues to use the multiple set and the AHI is 0.8. She has increased her exercise regimen but she has been frustrated that she hasn't lost weight in the meantime her Epworth sleepiness score is still mildly elevated at 12 points and her fatigue severity score is 40 points however these numbers are comparable to the last visit she was prior to using CPAP endorsing the Epworth score at 16 points.  Since she has a height of 6 foot 1 ", her BMI is 32.2.   Review of Systems: Out of a complete 14 system review, the patient complains of only the following symptoms, and all other reviewed systems are negative.  No longer nocturia. Epworth sleepiness score at 10 points and fatigue severity at 31 points. NO dreams,  no cataplexy and no hallucinations or sleep paralysis.  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     Social History   Socioeconomic History  . Marital status: Single    Spouse name: Not on file  . Number of children: 0  . Years of education: Masters  . Highest education level: Not on file  Occupational History    Employer: [employer]   Social Needs  . Financial resource strain: Not on file  . Food insecurity    Worry: Not on file    Inability: Not on file  . Transportation needs    Medical: Not on file    Non-medical: Not on file  Tobacco Use  . Smoking status: Never Smoker  . Smokeless tobacco: Never Used  Substance and Sexual Activity  . Alcohol use: Yes    Comment: occas.  . Drug use: No  . Sexual activity: Never  Lifestyle  . Physical activity    Days per week: Not on file    Minutes per session: Not on file  . Stress: Not on file  Relationships  . Social Musicianconnections    Talks on phone: Not on file    Gets together: Not on file    Attends religious service: Not on file    Active member of club or organization: Not on file    Attends meetings of clubs or organizations: Not on file    Relationship status: Not on file  . Intimate partner violence    Fear of current or ex partner: Not on file    Emotionally abused: Not on file    Physically abused: Not on file    Forced sexual activity: Not on file  Other Topics Concern  . Not on file  Social History Narrative   Patient is single and lives alone.   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   Patient has a Master's degree.  Patient is left-handed.   Patient drinks three cups of coffee per week and two cups of soda per week.    Family History  Problem Relation Age of Onset  . Hyperlipidemia Mother   . Hypertension Mother   . Hypertension Father   . Hypertension Brother   . Mental retardation Sister   . Hypertension Maternal Grandmother   . Stroke Maternal  Grandmother   . Hypertension Maternal Grandfather   . Hypertension Paternal Grandmother   . Heart disease Paternal Grandmother   . Hypertension Paternal Grandfather   . Glaucoma Maternal Grandfather   . Diabetes Paternal Grandmother   . Thyroid disease Maternal Grandmother     Past Medical History:  Diagnosis Date  . Allergy   . 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 Hypothyroidism   . Hypoxemia 05/15/2014  . Lupus (HCC)   . Osteopenia   . Snoring 05/04/2014  . Thyroid disease   . UARS (upper airway resistance syndrome) 05/15/2014    Past Surgical History:  Procedure Laterality Date  . THYROIDECTOMY Left 2009    Current Outpatient Medications  Medication Sig Dispense Refill  . ESTROGENS CONJUGATED PO Take 1 tablet by mouth daily.    . NP THYROID 120 MG tablet TK 1 T PO D    . Progesterone Micronized (PROGESTERONE PO) Take 1 tablet by mouth daily.    . Vitamin D, Ergocalciferol, (DRISDOL) 50000 units CAPS capsule TK ONE C PO ONCE WEEKLY  0   No current facility-administered medications for this visit.     Allergies as of 05/05/2019 - Review Complete 05/05/2019  Allergen Reaction Noted  . Influenza vaccines Hives 05/25/2014    Vitals: BP 127/82   Pulse 67   Temp (!) 97.5 F (36.4 C)   Ht 5\' 11"  (1.803 m)   Wt 251 lb (113.9 kg)   BMI 35.01 kg/m  Last Weight:  Wt Readings from Last 1 Encounters:  05/05/19 251 lb (113.9 kg)   Last Height:   Ht Readings from Last 1 Encounters:  05/05/19 5\' 11"  (1.803 m)    Physical exam:  General: The patient is awake, alert and appears not in acute distress.  The patient is well groomed, tanned. Head: Normocephalic, atraumatic. Neck is supple. Mallampati 4 , neck circumference:  17. 25 inches. No nasal septal deviation, no TMJ.  Habitual mouth breather.   Cardiovascular:  Regular rate and rhythm, without  murmurs or carotid bruit, and without distended neck veins. Respiratory: Lungs are clear to auscultation. Skin:  Without  evidence of edema, or rash Trunk: BMI  remains elevated at 35  Kg/m2 with  normal posture. Neurologic exam :The patient is awake and alert, oriented to place and time.  Memory -There is a normal attention span & concentration ability.  Speech is fluent without  dysarthria, dysphonia or aphasia.  Cranial nerves:Pupils are equal and briskly reactive to light.  Facial motor strength is symmetric and tongue and uvula move midline. Tongue protrusion into both cheeks was strong.  Motor exam:  Normal tone , muscle bulk and symmetric normal strength in all extremities. Stance is stable and normal.  Deep tendon reflexes: in the upper and lower extremities are symmetric and intact.   Assessment:  After physical and neurologic examination, review of laboratory studies, imaging, neurophysiology testing and pre-existing records,   the patient uses a nasal mask , a WISP in XL>  15 minute assessment is   1) OSA with UARS, on CPAP- reduced compliance but residual EDS has responded .  Plan:  Treatment plan and additional workup : continue CPAP  treatment :she is due t for a new machine by Christmas 2020- and we will meet at that time.  Rv in 12 month.  Melvyn Novas, MD  05-05-2019   Harrietta Guardian ,  MD [facility].

## 2019-05-05 NOTE — Patient Instructions (Signed)
NEW machine due by dec 2020.

## 2019-09-12 ENCOUNTER — Telehealth: Payer: Self-pay | Admitting: Neurology

## 2019-09-12 NOTE — Telephone Encounter (Signed)
Called the patient back. [place] and was due to be seen on dec 29th and was going to discuss getting a new machine. Patient is in town only until the 51 th due to having to come in earlier. Advised I have a opening on dec 3rd and she can have that if she would like. Apt scheduled advised that she would need to check in at 3 p.  ? DME [facility]?

## 2019-09-12 NOTE — Telephone Encounter (Signed)
Pt is asking for a call from RN to discuss a new CPAP

## 2019-09-15 ENCOUNTER — Encounter: Payer: Self-pay | Admitting: Neurology

## 2019-09-15 ENCOUNTER — Other Ambulatory Visit: Payer: Self-pay

## 2019-09-15 ENCOUNTER — Ambulatory Visit (INDEPENDENT_AMBULATORY_CARE_PROVIDER_SITE_OTHER): Payer: PRIVATE HEALTH INSURANCE | Admitting: Neurology

## 2019-09-15 VITALS — BP 128/82 | HR 83 | Temp 97.3°F | Ht 71.0 in | Wt 250.5 lb

## 2019-09-15 DIAGNOSIS — R0683 Snoring: Secondary | ICD-10-CM | POA: Diagnosis not present

## 2019-09-15 DIAGNOSIS — G4710 Hypersomnia, unspecified: Secondary | ICD-10-CM

## 2019-09-15 DIAGNOSIS — G4733 Obstructive sleep apnea (adult) (pediatric): Secondary | ICD-10-CM | POA: Diagnosis not present

## 2019-09-15 DIAGNOSIS — E890 Postprocedural hypothyroidism: Secondary | ICD-10-CM

## 2019-09-15 DIAGNOSIS — G478 Other sleep disorders: Secondary | ICD-10-CM | POA: Diagnosis not present

## 2019-09-15 DIAGNOSIS — Z9989 Dependence on other enabling machines and devices: Secondary | ICD-10-CM

## 2019-09-15 NOTE — Progress Notes (Signed)
[facility]  Provider:  Larey Seat, M D  Referring Provider: Primary Care Physician:  Dr Gertie Fey.   09-15-2019, RV for Deborah Rosales, a meanwhile 48 year-old female CPAP user.  I the pleasure of meeting today with Deborah Rosales who has been a compliant CPAP user in the past, over the last 30 days she has had interrupted CPAP use and only reached 60% compliance.  Between the seventh and the 29th and a 3-week.  She used the machine 17 days so her gap is really the first week of November.  She is using a minimum pressure of 5 the maximum pressure of 12 and has an EPR setting of 3 cmH2O usually average time of CPAP use is 5-1/2 hours at night but for this time.  It had been 3 hours and 19 minutes averaging with a gap.  She endorsed 9 points on the Epworth sleepiness score she did not get her fatigue score h   there is no evidence of significant depression, her sleep pattern has not significantly changed she is scheduled for thyroid biopsy tomorrow on 4 December and we wish her well.  Larey Seat, M.D.    05-05-2019, Interval History ; Deborah Rosales is a 48 y.o., left handed female patient living in [place] and using CPAP for OSA-  She has developed much better sleep since being treated with an estrogen cream, in AM and progesteron vaginally at night. She feels refreshed and restored.  She continues to use CPAP ; Deborah Rosales endorsed today the Epworth sleepiness score at 8 points endorsing question 1, 2 and 5 with 2 points each and the third and fourth question was 1.2 each.  Her fatigue severity score is much reduced 26 out of 36 points, and she also has continued to be a highly compliant CPAP user I have the data of the last 30 days with the last night being 29 April 2019 available, she uses the machine on average 6 hours and 2 minutes each night 93% compliance, minimum pressure of 5 maximum pressure of 12 with 3 cm EPR her machine is an air sense 10 AutoSet  serial #[id number].  Residual AHI is 1.3 which is a perfect resolution of apnea and is 95th percentile pressure is 9.2 there is no Cheyne-Stokes respiration.  And she does have minimal air leaks.  No changes in settings or interface are needed.    I have the pleasure of meeting Deborah Rosales today and meanwhile 48 year old female patient who has been established with CPAP care.   Today 03 May 2018 the patient has used her machine 80% of days and 67% for time with an average of 5 hours and 3 minutes total use per night the average usage on days when she uses her machine is 6 hours 18 minutes.  She is using an AutoSet between 5 and 12 cmH2O pressure with 3 cm EPR and a residual AHI of 1.5 results.  This is a good control of her apnea.  The 95th percentile pressure is 8.7.  I asked the patient to use her CPAP machine even when she naps and to put a reminder next to her think that she will not forget to put the CPAP on.  She has no difficulties with using CPAP, but sometimes simply goes to bed and forgets it.  Her Epworth sleepiness score was endorsed at 10 points fatigue severity at 27 points, she does not appear depressed. I would  like to follow yearly from their own. She works in [place]. Travelling  CPAP request.    HPI: interval history from 04/30/2017, I have pleasure of seeing Deborah Rosales today who has been calm using between 45 and the [place] for quite a while now. She has been compliant with her CPAP, but used another device for 6 days was in the last 30 days as she traveled. Her average user time on her main CPAP of 6 hours and 11 minutes. A residual AHI is only 1.6 per hour and there are no central apneas emerging under CPAP treatment. She does have some moderate air leaks, but her pressures maintained very well. My goal is to introduce her to a mask that may be more tolerable and less prone to air leakage. She has been using a nasal pillow. She reports an air-leak between tubing and mask.   With CPAP she has no nocturia and no headaches, no daytime sleepiness.       Deborah Rosales is a 48 y.o., left handed  female, who is seen here as a referral  from Dr. Thornton Papas  for CPAP compliance.  Mr. Bettendorf had been evaluated for the possibility of sleep apnea based and on a constant of snoring and daytime fatigue,  as well as morning headaches. The evaluation took place in January 2013 in [place], She was first evaluated in [place] in a specialty clinic , by Ear, Nose and Throat physicians.  A home sleep test was performed and showed that but she had some desaturations ( oxygen levels) she seemed not to have frank apneas.  The study concluded that the data collection time was too short to give a valid reading. The recorded data encompassed 10 hours and 10 minutes but the validity of the recording state that only 10 minutes per day to that were usable She also underwent a nocturnal pulse oximetry which showed her pulse rate to be +/-15 around 60 beats per minute. The study gave no evidence of frank apneas. Soon after , she moved to another city, [place] and had been scheduled at the [facility] for a repeat this time in the lab study that she could finally not attend.  She's here today to see 2-1/2 years later if there is by no evidence of sleep apnea, evidence of upper airway resistance he, will just simply snoring.  In addition, her complaint of morning headaches after waking up has increased in intensity and frequency.  She is also considered extremely daytime sleepy. She has gained weight over the last 2-1/2 years one of her concerns is a possibility of hypoventilation and CO2 retention.   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 In, she may read to 1 AM before she finally falls asleep. She also doesn't have a set rise time in summer. She has  however noticed that she wakes up with headaches in the morning but is not awoken by headaches. She I was falls asleep promptly, and will sleep through the night unfortunately she will rely on more than one alarm to be able to rise in the morning. She does not wake up spontaneously. She often feels that she would like another couple of hours of sleep in the morning and feels neither refreshed nor restored. She doesn 't recall any dreams.  The patient often naps in the afternoon even  after 8-10 hours of nocturnal sleep.  she does not wake up from a nap easily and that she sometimes falls asleep in the late afternoon When she wakes up , she has a generalized headache, throbbing , retreating over the early hours of the day.   She drinks caffeine , but not excessively- one cup in AM . No energy drinks. No SODA, no iced tea. On weekends she likes to drink Beer.  She is  physically active , teaches and takes Zumba classes. She does not use tobacco in any form,    05-07-14  PSG performed : The sleep study revealed mild apnea 11. 7  , 46 torr of CO2 retention.  But the patient had low oxygen levels (nadir 79%)  for a rather prolonged period of time. Her REM sleep dependent AHI was 11.7 RDI was 15.2 , supine  AHI was 16.4. 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 She needs to avoid supine sleep, as  only when sleeping on her back is apnea evident. We discussed the Tennisball method. She will resume weight watchers. She still has headaches in AM, hypnic headaches.  CPAP will allow for better hypoxemia control. CPAP to be set auto 5-12 cm water.  CD  Interval history from 05-14-15. Deborah Rosales is here for her yearly revisit she came all the way from [place]. I and had a very mild  form of apnea more accentuated upper airway resistance he syndrome and tolerate CPAP now very well. Her compliance is excellent 6 hours and 37 minutes on average at night 87% compliance for over 4 hours of consecutive use. She continues to use the multiple set and the AHI is 0.8. She has increased her exercise regimen but she has been frustrated that she hasn't lost weight in the meantime her Epworth sleepiness score is still mildly elevated at 12 points and her fatigue severity score is 40 points however these numbers are comparable to the last visit she was prior to using CPAP endorsing the Epworth score at 16 points.  Since she has a height of 6 foot 1 ", her BMI is 32.2.   Review of Systems: Out of a complete 14 system review, the patient complains of only the following symptoms, and all other reviewed systems are negative.  No longer nocturia. Epworth sleepiness score at 10 points and fatigue severity at 31 points. NO dreams, no cataplexy and no hallucinations or sleep paralysis.  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   How likely are you to doze in the following situations: 0 = not likely, 1 = slight chance, 2 = moderate chance, 3 = high chance  Sitting and Reading? Watching Television? Sitting inactive in a public place (theater or meeting)? Lying down in the afternoon when circumstances permit? Sitting and talking to someone? Sitting quietly after lunch without alcohol? In a car, while stopped for a few minutes in traffic? As a passenger in a car for an hour without a break?  Total = 9/ 24     Social History   Socioeconomic History  . Marital status: Single    Spouse name: Not on file  . Number of children: 0  . Years of education: Masters  . Highest education level: Not on  file  Occupational History    Employer: [employer]   Social Needs  . Financial resource strain: Not on file  . Food insecurity    Worry: Not  on file    Inability: Not on file  . Transportation needs    Medical: Not on file    Non-medical: Not on file  Tobacco Use  . Smoking status: Never Smoker  . Smokeless tobacco: Never Used  Substance and Sexual Activity  . Alcohol use: Yes    Comment: occas.  . Drug use: No  . Sexual activity: Never  Lifestyle  . Physical activity    Days per week: Not on file    Minutes per session: Not on file  . Stress: Not on file  Relationships  . Social Musicianconnections    Talks on phone: Not on file    Gets together: Not on file    Attends religious service: Not on file    Active member of club or organization: Not on file    Attends meetings of clubs or organizations: Not on file    Relationship status: Not on file  . Intimate partner violence    Fear of current or ex partner: Not on file    Emotionally abused: Not on file    Physically abused: Not on file    Forced sexual activity: Not on file  Other Topics Concern  . Not on file  Social History Narrative   Patient is single and lives alone.   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   Patient has a Master's degree.   Patient is left-handed.   Patient drinks three cups of coffee per week and two cups of soda per week.    Family History  Problem Relation Age of Onset  . Hyperlipidemia Mother   . Hypertension Mother   . Hypertension Father   . Hypertension Brother   . Mental retardation Sister   . Hypertension Maternal Grandmother   . Stroke Maternal Grandmother   . Thyroid disease Maternal Grandmother   . Hypertension Maternal Grandfather   . Glaucoma Maternal Grandfather   . Hypertension Paternal Grandmother   . Heart disease Paternal Grandmother   . Diabetes Paternal Grandmother   . Hypertension Paternal Grandfather     Past Medical History:  Diagnosis Date  . Allergy   . 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 Hypothyroidism   . Hypoxemia 05/15/2014  . Lupus (HCC)   . Osteopenia   . Snoring 05/04/2014  . Thyroid disease   .  UARS (upper airway resistance syndrome) 05/15/2014    Past Surgical History:  Procedure Laterality Date  . THYROIDECTOMY Left 2009    Current Outpatient Medications  Medication Sig Dispense Refill  . conjugated estrogens (PREMARIN) vaginal cream Place 1 Applicatorful vaginally daily.    . NP THYROID 120 MG tablet TK 1 T PO D    . Progesterone Micronized (PROGESTERONE PO) Take 1 tablet by mouth daily.    . Vitamin D, Ergocalciferol, (DRISDOL) 50000 units CAPS capsule TK ONE C PO ONCE WEEKLY  0   No current facility-administered medications for this visit.     Allergies as of 09/15/2019 - Review Complete 09/15/2019  Allergen Reaction Noted  . Influenza vaccines Hives 05/25/2014    Vitals: BP 128/82   Pulse 83   Temp (!) 97.3 F (36.3 C)   Ht 5\' 11"  (1.803 m)   Wt 250 lb 8 oz (113.6 kg)   BMI 34.94 kg/m  Last  Weight:  Wt Readings from Last 1 Encounters:  09/15/19 250 lb 8 oz (113.6 kg)   Last Height:   Ht Readings from Last 1 Encounters:  09/15/19  (1.803 m)    Physical exam:  General: The patient is awake, alert and appears not in acute distress.  The patient is well groomed, tanned. Head: Normocephalic, atraumatic. Neck is supple. Mallampati 4 , neck circumference:  17. 25 inches. No nasal septal deviation, no TMJ.  Habitual mouth breather.   Cardiovascular:  Regular rate and rhythm, without  murmurs or carotid bruit, and without distended neck veins. Respiratory: Lungs are clear to auscultation. Skin:  Without evidence of edema, or rash Trunk: BMI  remains elevated at 35 Kg/m2 again .Marland Kitchen Neurologic exam :The patient is awake and alert, oriented to place and time.  Memory -There is a normal attention span & concentration ability.  Speech is fluent without  dysarthria, dysphonia or aphasia.  Cranial nerves:Pupils are equal and briskly reactive to light.  Facial motor strength is symmetric and tongue and uvula move midline.  Tongue protrusion into both cheeks  was strong.  Motor exam:  Normal tone , muscle bulk and symmetric normal strength in all extremities. Stance is stable and normal.  Deep tendon reflexes: in the upper and lower extremities are symmetric and intact.   Assessment:  After physical and neurologic examination, review of laboratory studies, imaging, neurophysiology testing and pre-existing records,   The patient uses a nasal mask , a WISP in XL>  15 minute assessment is   1) OSA with UARS, on a 50 year old CPAP , needing a new machine.  CPAP- further reduced compliance but residual EDS has responded .   Plan:  Treatment plan and additional workup : continue CPAP  treatment :she is due t for a new machine by Christmas 2020 Rv in 12 month.  Melvyn Novas, MD  05-05-2019   Harrietta Guardian ,  MD [facility].

## 2019-09-15 NOTE — Progress Notes (Signed)
Community message sent to [facility] for new CPAP before Dec 18th.

## 2019-09-15 NOTE — Patient Instructions (Signed)
Please await insurance decision if a HST is needed to order a new CPAP !!

## 2019-09-26 ENCOUNTER — Other Ambulatory Visit: Payer: Self-pay

## 2019-09-26 ENCOUNTER — Ambulatory Visit (INDEPENDENT_AMBULATORY_CARE_PROVIDER_SITE_OTHER): Payer: PRIVATE HEALTH INSURANCE | Admitting: Neurology

## 2019-09-26 DIAGNOSIS — Z9989 Dependence on other enabling machines and devices: Secondary | ICD-10-CM

## 2019-09-26 DIAGNOSIS — G4733 Obstructive sleep apnea (adult) (pediatric): Secondary | ICD-10-CM | POA: Diagnosis not present

## 2019-09-26 DIAGNOSIS — R519 Headache, unspecified: Secondary | ICD-10-CM

## 2019-10-03 DIAGNOSIS — R519 Headache, unspecified: Secondary | ICD-10-CM | POA: Insufficient documentation

## 2019-10-03 NOTE — Addendum Note (Signed)
Addended by: Larey Seat on: 10/03/2019 12:06 PM   Modules accepted: Orders

## 2019-10-03 NOTE — Progress Notes (Signed)
When she wakes up, she has a generalized headache, throbbing,  retreating over the early hours of the day. Her CPAP has been set  to 5-12 cm water and 3 cm EPR,    Summary & Diagnosis:   The patient's current AHI baseline is 20.1/h and REM AHI 37.3/h.  no clinically significant hypoxia, moderate snoring.  This is REM dependent apnea and should be treated with CPAP. Her  machine was issued in 2015 and needs to be replaced.   Recommendations:    This is REM dependent apnea and should be treated with CPAP. Her  machine was issued in 2015 and needs to be replaced.  I will order an autotitration capable machine with heated  humidity and a pressure setting from 5-16 cm water and 2 cm EPR,  mask of her choice.   Interpreting Physician: Larey Seat, MD

## 2019-10-03 NOTE — Procedures (Signed)
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 She also doesn't have a set rise time in summer. She has however noticed that she wakes up with headaches in the morning but is not awoken by headaches. She falls asleep promptly and will sleep through the night. Unfortunately, she will rely on more than one alarm to be able to rise in the morning. She does not wake up spontaneously. She often feels that she would like another couple of hours of sleep in the morning and feels neither refreshed nor restored. She doesn't 't recall any dreams.  The patient often naps in the afternoon even after 8-10 hours of nocturnal sleep. She does not wake up from a nap easily and that she sometimes falls asleep in the late afternoon.  When she wakes up, she has a generalized headache, throbbing, retreating over the early hours of the day. Her CPAP has been set to 5-12 cm water and 3 cm EPR,    Summary & Diagnosis:    The patient's current AHI baseline is 20.1/h and REM AHI 37.3/h. no clinically significant hypoxia, moderate snoring.  This is REM dependent apnea and should be treated with CPAP. Her machine was issued in 2015 and needs to be replaced.   Recommendations:     This is REM dependent apnea and should be treated with CPAP. Her machine was issued in 2015 and needs to be replaced.  I will order an autotitration capable machine with heated humidity and a pressure setting from 5-16 cm water and 2 cm  EPR, mask of her choice.   Interpreting Physician: Larey Seat, MD             Sleep Summary    Oxygen Saturation Statistics     Start Study Time: End Study Time: Total Recording Time:          11:01:27 PM 9:05:28 AM 10 h, 4 min  Total Sleep Time % REM of Sleep Time:  7 h, 15 min  26.1    Mean: 95 Minimum: 83 Maximum: 100  Mean of Desaturations Nadirs (%):   91  Oxygen Desaturation. %:   4-9 10-20 >20 Total  Events Number Total    71  6 92.2 7.8  0 0.0  77 100.0  Oxygen Saturation: <90 <=88 <85 <80 <70  Duration (minutes): Sleep % 4.2 1.0  2.0 0.1  0.5 0.0 0.0 0.0 0.0 0.0     Respiratory Indices      Total Events REM NREM All Night  pRDI:  162  pAHI:  145 ODI:  77  pAHIc:  0  % CSR: 0.0 37.3 37.3 24.0 0.0 17.2 14.0 6.0 0.0 22.4 20.1 10.7 0.0       Pulse Rate Statistics during Sleep (BPM)      Mean:  60 Minimum: 48 Maximum: 84    Indices are calculated using technically valid sleep  time of  7 h, 13 min. pRDI/pAHI are calculated using oxi desaturations ? 3%  Body Position Statistics  Position Supine Prone Right Left Non-Supine  Sleep (min) 109.5 0.0 115.5 210.0 325.5  Sleep % 25.2 0.0 26.6 48.3 74.8  pRDI 42.8 N/A 16.7 14.9 15.6  pAHI 40.6 N/A 14.1 12.6 13.1  ODI 20.3 N/A 7.8 7.2 7.4     Snoring Statistics Snoring Level (dB) >40 >50 >60 >70 >80 >Threshold (45)  Sleep (min) 149.9 7.9 1.4 0.1 0.0 37.8  Sleep % 34.5 1.8 0.3 0.0 0.0 8.7    Mean: 41 dB

## 2019-10-04 ENCOUNTER — Telehealth: Payer: Self-pay | Admitting: Neurology

## 2019-10-04 ENCOUNTER — Encounter: Payer: Self-pay | Admitting: Neurology

## 2019-10-04 NOTE — Telephone Encounter (Signed)
Called patient to discuss sleep study results. No answer at this time. LVM for the patient to call back.  Appears this number is Adrianne's number.

## 2019-10-04 NOTE — Telephone Encounter (Signed)
Patient returned missed call in regards to her sleep study results. Please follow up.

## 2019-10-04 NOTE — Telephone Encounter (Signed)
-----   Message from Larey Seat, MD sent at 10/03/2019 12:06 PM EST ----- When she wakes up, she has a generalized headache, throbbing,  retreating over the early hours of the day. Her CPAP has been set  to 5-12 cm water and 3 cm EPR,    Summary & Diagnosis:   The patient's current AHI baseline is 20.1/h and REM AHI 37.3/h.  no clinically significant hypoxia, moderate snoring.  This is REM dependent apnea and should be treated with CPAP. Her  machine was issued in 2015 and needs to be replaced.   Recommendations:    This is REM dependent apnea and should be treated with CPAP. Her  machine was issued in 2015 and needs to be replaced.  I will order an autotitration capable machine with heated  humidity and a pressure setting from 5-16 cm water and 2 cm EPR,  mask of her choice.   Interpreting Physician: Larey Seat, MD

## 2019-10-05 NOTE — Telephone Encounter (Addendum)
Called the patient back and went to [employer]. LVM for a call back.  IF patient calls back may we confirm her number is correct as the first one to call

## 2019-10-05 NOTE — Telephone Encounter (Signed)
Pt returned call.The number is correct on file. I advised pt that Dr. Brett Fairy reviewed their sleep study results and found that pt still has OSA present in moderate to severe range. Dr. Brett Fairy recommends that pt starts auto CPAP 5-16 cm water pressure. I reviewed PAP compliance expectations with the pt. Pt states that in the last office visit Dr Brett Fairy wrote the auto settings to be 6-13 cm water pressure and she was tolerated around 11 cm water pressure according to the download. Patient asked if instead of changing the pressure could they stay at the previous range. Advised we would leave that there. Since patient was set up 09/21/20 with a new machine through [facility] already, A follow up appt was made for insurance purposes with Ward Givens, NP on 11/22/2019 at 2:30pm on mychart [facility].  Pt verbalized understanding of results. Pt had no questions at this time but was encouraged to call back if questions arise

## 2019-10-11 ENCOUNTER — Ambulatory Visit: Payer: PRIVATE HEALTH INSURANCE | Admitting: Neurology

## 2019-11-22 ENCOUNTER — Telehealth: Payer: Self-pay | Admitting: Adult Health

## 2019-11-22 DIAGNOSIS — Z0289 Encounter for other administrative examinations: Secondary | ICD-10-CM

## 2019-12-26 ENCOUNTER — Telehealth: Payer: Self-pay

## 2019-12-26 NOTE — Telephone Encounter (Signed)
I called pt to update her chart prior to her visit. No answer, left a message asking her to call me back.  Also, there is no data for pt's cpap since 11/17/19. I was calling to confirm that this is accurate.

## 2019-12-27 ENCOUNTER — Telehealth: Payer: Self-pay | Admitting: Adult Health

## 2019-12-28 ENCOUNTER — Telehealth: Payer: PRIVATE HEALTH INSURANCE | Admitting: Adult Health

## 2019-12-28 NOTE — Progress Notes (Deleted)
PATIENT: Deborah Rosales DOB: [birth year]  REASON FOR VISIT: CPAP compliance visit HISTORY FROM: patient [facility] provider: Dr. Vickey Huger    Virtual Visit via Video Note  I connected with Deborah Rosales on 12/28/19 at  9:45 AM EDT by a video enabled telemedicine application located remotely in my own home and verified that I am speaking with the correct person using two identifiers who was located at their own home.   I discussed the limitations of evaluation and management by telemedicine and the availability of in person appointments. The patient expressed understanding and agreed to proceed.     HISTORY OF PRESENT ILLNESS:   Deborah Rosales is a 49 year old female who is being seen today, 12/28/2019, via virtual visit for CPAP follow-up.  She underwent repeat sleep study on 09/15/2019 for replacement of CPAP machine with baseline AHI 20.1/h and REM AHI 37.3/h.  Compliance report 10/19/2019 -11/17/2019 shows 19 out of 30 usage days with 18 days greater than 4 hours for 60% compliance.  Average usage 6 hours and 29 minutes with residual AHI 1.3.  Leaks in the 95th percentile 5.9 L/min.  Pressure in the 95th percentile 10.8 cm H2O.  Minimum pressure 6 cm H2O and max pressure 13 cm H2O with EPR level 1.    History provided for reference purposes only 09-15-2019 CD, RV for Deborah Rosales, a meanwhile 49 year-old female CPAP user.  I the pleasure of meeting today with Deborah Rosales who has been a compliant CPAP user in the past, over the last 30 days she has had interrupted CPAP use and only reached 60% compliance.  Between the seventh and the 29th and a 3-week.  She used the machine 17 days so her gap is really the first week of November.  She is using a minimum pressure of 5 the maximum pressure of 12 and has an EPR setting of 3 cmH2O usually average time of CPAP use is 5-1/2 hours at night but for this time.  It had been 3 hours and 19 minutes averaging with a gap.  She endorsed 9 points on the Epworth sleepiness  score she did not get her fatigue score h  05-05-2019, Interval History CD ; Deborah Rosales is a 49 y.o., left handed female patient living in [place] and using CPAP for OSA-  She has developed much better sleep since being treated with an estrogen cream, in AM and progesteron vaginally at night. She feels refreshed and restored.  She continues to use CPAP ; Deborah Rosales endorsed today the Epworth sleepiness score at 8 points endorsing question 1, 2 and 5 with 2 points each and the third and fourth question was 1.2 each.  Her fatigue severity score is much reduced 26 out of 36 points, and she also has continued to be a highly compliant CPAP user I have the data of the last 30 days with the last night being 29 April 2019 available, she uses the machine on average 6 hours and 2 minutes each night 93% compliance, minimum pressure of 5 maximum pressure of 12 with 3 cm EPR her machine is an air sense 10 AutoSet serial #[id number].  Residual AHI is 1.3 which is a perfect resolution of apnea and is 95th percentile pressure is 9.2 there is no Cheyne-Stokes respiration.  And she does have minimal air leaks.  No changes in settings or interface are needed.  Update 05/03/2018 CD: I have the pleasure of meeting Deborah Rosales. Deborah Rosales today and  meanwhile 49 year old female patient who has been established with CPAP care.   Today 03 May 2018 the patient has used her machine 80% of days and 67% for time with an average of 5 hours and 3 minutes total use per night the average usage on days when she uses her machine is 6 hours 18 minutes.  She is using an AutoSet between 5 and 12 cmH2O pressure with 3 cm EPR and a residual AHI of 1.5 results.  This is a good control of her apnea.  The 95th percentile pressure is 8.7.  I asked the patient to use her CPAP machine even when she naps and to put a reminder next to her think that she will not forget to put the CPAP on.  She has no difficulties with using CPAP, but sometimes simply goes  to bed and forgets it.  Her Epworth sleepiness score was endorsed at 10 points fatigue severity at 27 points, she does not appear depressed. I would like to follow yearly from their own. She works in [place]. Travelling  CPAP request.      Review of Systems: Out of a complete 14 system review, the patient complains of only the following symptoms, and all other reviewed systems are negative.  No longer nocturia. Epworth sleepiness score at 10 points and fatigue severity at 31 points. NO dreams, no cataplexy and no hallucinations or sleep paralysis.  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   How likely are you to doze in the following situations: 0 = not likely, 1 = slight chance, 2 = moderate chance, 3 = high chance  Sitting and Reading? Watching Television? Sitting inactive in a public place (theater or meeting)? Lying down in the afternoon when circumstances permit? Sitting and talking to someone? Sitting quietly after lunch without alcohol? In a car, while stopped for a few minutes in traffic? As a passenger in a car for an hour without a break?  Total = 9/ 24     Social History   Socioeconomic History  . Marital status: Single    Spouse name: Not on file  . Number of children: 0  . Years of education: Masters  . Highest education level: Not on file  Occupational History    Employer: [employer]   Tobacco Use  . Smoking status: Never Smoker  . Smokeless tobacco: Never Used  Substance and Sexual Activity  . Alcohol use: Yes    Comment: occas.  . Drug use: No  . Sexual activity: Never  Other Topics Concern  . Not on file  Social History Narrative   Patient is single and lives alone.   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   Patient is left-handed.   Patient drinks three cups of coffee per week and two cups of soda per week.   Social Determinants of Health   Financial Resource  Strain:   . Difficulty of Paying Living Expenses:   Food Insecurity:   . Worried About Programme researcher, broadcasting/film/video in the Last Year:   . Barista in the Last Year:   Transportation Needs:   . Freight forwarder (Medical):   Marland Kitchen Lack of Transportation (Non-Medical):   Physical Activity:   . Days of Exercise per Week:   . Minutes of Exercise per Session:   Stress:   . Feeling of Stress :   Social Connections:   .  Frequency of Communication with Friends and Family:   . Frequency of Social Gatherings with Friends and Family:   . Attends Religious Services:   . Active Member of Clubs or Organizations:   . Attends Archivist Meetings:   Marland Kitchen Marital Status:   Intimate Partner Violence:   . Fear of Current or Ex-Partner:   . Emotionally Abused:   Marland Kitchen Physically Abused:   . Sexually Abused:     Family History  Problem Relation Age of Onset  . Hyperlipidemia Mother   . Hypertension Mother   . Hypertension Father   . Hypertension Brother   . Mental retardation Sister   . Hypertension Maternal Grandmother   . Stroke Maternal Grandmother   . Thyroid disease Maternal Grandmother   . Hypertension Maternal Grandfather   . Glaucoma Maternal Grandfather   . Hypertension Paternal Grandmother   . Heart disease Paternal Grandmother   . Diabetes Paternal Grandmother   . Hypertension Paternal Grandfather     Past Medical History:  Diagnosis Date  . Allergy   . 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 Hypothyroidism   . Hypoxemia 05/15/2014  . Lupus ([place])   . Osteopenia   . Snoring 05/04/2014  . Thyroid disease   . UARS (upper airway resistance syndrome) 05/15/2014    Past Surgical History:  Procedure Laterality Date  . THYROIDECTOMY Left 2009    Current Outpatient Medications  Medication Sig Dispense Refill  . conjugated estrogens (PREMARIN) vaginal cream Place 1 Applicatorful vaginally daily.    . NP THYROID 120 MG tablet TK 1 T PO D    . Progesterone Micronized  (PROGESTERONE PO) Take 1 tablet by mouth daily.    . Vitamin D, Ergocalciferol, (DRISDOL) 50000 units CAPS capsule TK ONE C PO ONCE WEEKLY  0   No current facility-administered medications for this visit.    Allergies as of 12/28/2019 - Review Complete 09/15/2019  Allergen Reaction Noted  . Influenza vaccines Hives 05/25/2014    Vitals: There were no vitals taken for this visit. Last Weight:  Wt Readings from Last 1 Encounters:  09/15/19 250 lb 8 oz (113.6 kg)   Last Height:   Ht Readings from Last 1 Encounters:  09/15/19 5\' 11"  (1.803 m)    Physical exam:  General: The patient is awake, alert and appears not in acute distress.  The patient is well groomed, tanned. Head: Normocephalic, atraumatic. Neck is supple. Mallampati 4 , neck circumference:  17. 25 inches. No nasal septal deviation, no TMJ.  Habitual mouth breather.   Cardiovascular:  Regular rate and rhythm, without  murmurs or carotid bruit, and without distended neck veins. Respiratory: Lungs are clear to auscultation. Skin:  Without evidence of edema, or rash Trunk: BMI  remains elevated at 35 Kg/m2 again .Marland Kitchen Neurologic exam :The patient is awake and alert, oriented to place and time.  Memory -There is a normal attention span & concentration ability.  Speech is fluent without  dysarthria, dysphonia or aphasia.  Cranial nerves:Pupils are equal and briskly reactive to light.  Facial motor strength is symmetric and tongue and uvula move midline.  Tongue protrusion into both cheeks was strong.  Motor exam:  Normal tone , muscle bulk and symmetric normal strength in all extremities. Stance is stable and normal.  Deep tendon reflexes: in the upper and lower extremities are symmetric and intact.       Assessment:  After physical and neurologic examination, review of laboratory studies, imaging, neurophysiology testing and pre-existing records,  The patient uses a nasal mask , a WISP in XL>  15 minute assessment is     1) OSA with UARS, on a 49 year old CPAP , needing a new machine.  CPAP- further reduced compliance but residual EDS has responded .   Plan:  Treatment plan and additional workup : continue CPAP  treatment :she is due t for a new machine by Christmas 2020 Rv in 12 month.     Ihor Austin, AGNP-BC  [facility] [address] [place], [place] [address]  Phone [phone] Fax [phone] Note: This document was prepared with digital dictation and possible smart phrase technology. Any transcriptional errors that result from this process are unintentional.

## 2019-12-31 ENCOUNTER — Encounter: Payer: Self-pay | Admitting: Neurology

## 2020-04-05 ENCOUNTER — Encounter: Payer: Self-pay | Admitting: Neurology

## 2020-04-05 ENCOUNTER — Other Ambulatory Visit: Payer: Self-pay

## 2020-04-05 ENCOUNTER — Ambulatory Visit (INDEPENDENT_AMBULATORY_CARE_PROVIDER_SITE_OTHER): Payer: PRIVATE HEALTH INSURANCE | Admitting: Neurology

## 2020-04-05 VITALS — BP 126/81 | HR 70 | Ht 71.0 in | Wt 249.0 lb

## 2020-04-05 DIAGNOSIS — G478 Other sleep disorders: Secondary | ICD-10-CM | POA: Diagnosis not present

## 2020-04-05 DIAGNOSIS — E890 Postprocedural hypothyroidism: Secondary | ICD-10-CM

## 2020-04-05 DIAGNOSIS — G4733 Obstructive sleep apnea (adult) (pediatric): Secondary | ICD-10-CM | POA: Diagnosis not present

## 2020-04-05 DIAGNOSIS — Z9989 Dependence on other enabling machines and devices: Secondary | ICD-10-CM

## 2020-04-05 NOTE — Progress Notes (Signed)
[facility]  Provider:  Larey Seat, M D  Referring Provider: Primary Care Physician:  Dr Gertie Fey.   Interval history from 05 April 2020, has a prior pleasure of meeting with Deborah Rosales today and meanwhile 49 year old patient had long-term CPAP user whom I have followed for over 7 years.  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  She is using her CPAP compliantly at 85% with an average use at time of 5 hours 45 minutes, this is a persistent AutoSet air sense 10 machine that was renewed last year she has a minimum pressure setting of 6 maximum pressure of 13 and 1 cm expiratory pressure relief with an AHI of 1.7.  This is excellent resolution her 95th percentile pressure is 10.7 I do not need to make any changes I will asked the patient to continue with the best fitting interface which seems to work for her and to try to use the machine each day.  Her CPAP has been set  to 5-12 cm water and 3 cm EPR,    Summary & Diagnosis:   The patient's current AHI baseline is 20.1/h and REM AHI 37.3/h.  no clinically significant hypoxia, moderate snoring.  This is REM dependent apnea and should be treated with CPAP.   Recommendations:    Her  machine was issued in 2015 and needs to be replaced.  I will order an autotitration capable machine with heated  humidity and a pressure setting from 5-16 cm water and 2 cm EPR,  mask of her choice.   Interpreting Physician: Larey Seat, MD     Procedures by Larey Seat, MD at 09/26/2019 2:00 PM       09-15-2019, RV for Deborah Rosales, a meanwhile 49 year-old female CPAP user.  I the pleasure of meeting today with Deborah Rosales who has been a compliant CPAP user in the past, over the last 30 days she has had interrupted CPAP use and only reached 60% compliance.  Between the seventh and the 29th and a 3-week.  She used  the machine 17 days so her gap is really the first week of November.  She is using a minimum pressure of 5 the maximum pressure of 12 and has an EPR setting of 3 cmH2O usually average time of CPAP use is 5-1/2 hours at night but for this time.  It had been 3 hours and 19 minutes averaging with a gap.  She endorsed 9 points on the Epworth sleepiness score she did not get her fatigue score h   there is no evidence of significant depression, her sleep pattern has not significantly changed she is scheduled for thyroid biopsy tomorrow on 4 December and we wish her well.  Larey Seat, M.D.    05-05-2019, Interval History ; Deborah Rosales is a 49 y.o., left handed female patient living in [place] and using CPAP for OSA-  She has developed much better sleep since being treated with an estrogen cream, in AM and progesteron vaginally at night. She feels refreshed and restored.  She continues to use CPAP ; Deborah Rosales endorsed today the Epworth sleepiness score at 8 points endorsing question 1, 2 and 5 with 2 points each and the third and fourth question was 1.2 each.  Her fatigue severity score is much reduced 26 out of 36 points, and she also has continued to be a highly compliant CPAP user  I have the data of the last 30 days with the last night being 29 April 2019 available, she uses the machine on average 6 hours and 2 minutes each night 93% compliance, minimum pressure of 5 maximum pressure of 12 with 3 cm EPR her machine is an air sense 10 AutoSet serial #[id number].  Residual AHI is 1.3 which is a perfect resolution of apnea and is 95th percentile pressure is 9.2 there is no Cheyne-Stokes respiration.  And she does have minimal air leaks.  No changes in settings or interface are needed.    I have the pleasure of meeting [name]. [place] today and meanwhile 49 year old female patient who has been established with CPAP care.   Today 03 May 2018 the patient has used her machine 80% of days and 67% for  time with an average of 5 hours and 3 minutes total use per night the average usage on days when she uses her machine is 6 hours 18 minutes.  She is using an AutoSet between 5 and 12 cmH2O pressure with 3 cm EPR and a residual AHI of 1.5 results.  This is a good control of her apnea.  The 95th percentile pressure is 8.7.  I asked the patient to use her CPAP machine even when she naps and to put a reminder next to her think that she will not forget to put the CPAP on.  She has no difficulties with using CPAP, but sometimes simply goes to bed and forgets it.  Her Epworth sleepiness score was endorsed at 10 points fatigue severity at 27 points, she does not appear depressed. I would like to follow yearly from their own. She works in [place]. Travelling  CPAP request.    HPI: interval history from 04/30/2017, I have pleasure of seeing Deborah Rosales today who has been calm using between 86 and the [place] for quite a while now. She has been compliant with her CPAP, but used another device for 6 days was in the last 30 days as she traveled. Her average user time on her main CPAP of 6 hours and 11 minutes. A residual AHI is only 1.6 per hour and there are no central apneas emerging under CPAP treatment. She does have some moderate air leaks, but her pressures maintained very well. My goal is to introduce her to a mask that may be more tolerable and less prone to air leakage. She has been using a nasal pillow. She reports an air-leak between tubing and mask.  With CPAP she has no nocturia and no headaches, no daytime sleepiness.       Deborah Rosales is a 49 y.o., left handed  female, who is seen here as a referral  from Dr. Thornton Papas  for CPAP compliance.  Mr. Dutan had been evaluated for the possibility of sleep apnea based and on a constant of snoring and daytime fatigue,  as well as morning headaches. The evaluation took place in January 2013 in [place], She was first evaluated in [place] in a specialty clinic ,  by Ear, Nose and Throat physicians.  A home sleep test was performed and showed that but she had some desaturations ( oxygen levels) she seemed not to have frank apneas.  The study concluded that the data collection time was too short to give a valid reading. The recorded data encompassed 10 hours and 10 minutes but the validity of the recording state that only 10 minutes per day to that were  usable She also underwent a nocturnal pulse oximetry which showed her pulse rate to be +/-15 around 60 beats per minute. The study gave no evidence of frank apneas. Soon after , she moved to another city, [place] and had been scheduled at the [facility] for a repeat this time in the lab study that she could finally not attend.  She's here today to see 2-1/2 years later if there is by no evidence of sleep apnea, evidence of upper airway resistance he, will just simply snoring.  In addition, her complaint of morning headaches after waking up has increased in intensity and frequency.  She is also considered extremely daytime sleepy. She has gained weight over the last 2-1/2 years one of her concerns is a possibility of hypoventilation and CO2 retention.   Deborah. 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 In, she may read to 1 AM before she finally falls asleep. She also doesn't have a set rise time in summer. She has however noticed that she wakes up with headaches in the morning but is not awoken by headaches. She I was falls asleep promptly, and will sleep through the night unfortunately she will rely on more than one alarm to be able to rise in the morning. She does not wake up spontaneously. She often feels that she would like another couple of hours of sleep in the morning and feels neither refreshed nor restored. She doesn 't recall any dreams.  The patient often naps in the  afternoon even after 8-10 hours of nocturnal sleep.  she does not wake up from a nap easily and that she sometimes falls asleep in the late afternoon When she wakes up , she has a generalized headache, throbbing , retreating over the early hours of the day.   She drinks caffeine , but not excessively- one cup in AM . No energy drinks. No SODA, no iced tea. On weekends she likes to drink Beer.  She is  physically active , teaches and takes Zumba classes. She does not use tobacco in any form,    05-07-14  PSG performed : The sleep study revealed mild apnea 11. 7  , 46 torr of CO2 retention.  But the patient had low oxygen levels (nadir 79%)  for a rather prolonged period of time. Her REM sleep dependent AHI was 11.7 RDI was 15.2 , supine  AHI was 16.4. 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 She needs to avoid supine sleep, as  only when sleeping on her back is apnea evident. We discussed the Tennisball method. She will resume weight watchers. She still has headaches in AM, hypnic headaches.  CPAP will allow for better hypoxemia control. CPAP to be set auto 5-12 cm water.  CD  Interval history from 05-14-15. Deborah Rosales is here for her yearly revisit she came all the way from [place]. I and had a very mild form of apnea more accentuated upper airway resistance he syndrome and tolerate CPAP now very well. Her compliance is excellent 6 hours and 37 minutes on average at night 87% compliance for over 4 hours of consecutive use. She continues  to use the multiple set and the AHI is 0.8. She has increased her exercise regimen but she has been frustrated that she hasn't lost weight in the meantime her Epworth sleepiness score is still mildly elevated at 12 points and her fatigue  severity score is 40 points however these numbers are comparable to the last visit she was prior to using CPAP endorsing the Epworth score at 16 points.  Since she has a height of 6 foot 1 ", her BMI is 32.2.   Review of Systems: Out of a complete 14 system review, the patient complains of only the following symptoms, and all other reviewed systems are negative.  No longer nocturia. Epworth sleepiness score at 10 points and fatigue severity at 31 points. NO dreams, no cataplexy and no hallucinations or sleep paralysis.  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   How likely are you to doze in the following situations: 0 = not likely, 1 = slight chance, 2 = moderate chance, 3 = high chance  Sitting and Reading? Watching Television? Sitting inactive in a public place (theater or meeting)? Lying down in the afternoon when circumstances permit? Sitting and talking to someone? Sitting quietly after lunch without alcohol? In a car, while stopped for a few minutes in traffic? As a passenger in a car for an hour without a break?  Total = 9/ 24 - stayed the same level.    Social History   Socioeconomic History  . Marital status: Single    Spouse name: Not on file  . Number of children: 0  . Years of education: Masters  . Highest education level: Not on file  Occupational History    Employer: [employer]   Tobacco Use  . Smoking status: Never Smoker  . Smokeless tobacco: Never Used  Substance and Sexual Activity  . Alcohol use: Yes    Comment: occas.  . Drug use: No  . Sexual activity: Never  Other Topics Concern  . Not on file  Social History Narrative   Patient is single and lives alone.   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   Patient is left-handed.   Patient drinks three cups of coffee per week and two cups of soda per week.   Social Determinants of Health   Financial Resource Strain:    . Difficulty of Paying Living Expenses:   Food Insecurity:   . Worried About Programme researcher, broadcasting/film/video in the Last Year:   . Barista in the Last Year:   Transportation Needs:   . Freight forwarder (Medical):   Marland Kitchen Lack of Transportation (Non-Medical):   Physical Activity:   . Days of Exercise per Week:   . Minutes of Exercise per Session:   Stress:   . Feeling of Stress :   Social Connections:   . Frequency of Communication with Friends and Family:   . Frequency of Social Gatherings with Friends and Family:   . Attends Religious Services:   . Active Member of Clubs or Organizations:   . Attends Banker Meetings:   Marland Kitchen Marital Status:   Intimate Partner Violence:   . Fear of Current or Ex-Partner:   . Emotionally Abused:   Marland Kitchen Physically Abused:   . Sexually Abused:     Family History  Problem Relation Age of Onset  . Hyperlipidemia Mother   . Hypertension Mother   .  Hypertension Father   . Hypertension Brother   . Mental retardation Sister   . Hypertension Maternal Grandmother   . Stroke Maternal Grandmother   . Thyroid disease Maternal Grandmother   . Hypertension Maternal Grandfather   . Glaucoma Maternal Grandfather   . Hypertension Paternal Grandmother   . Heart disease Paternal Grandmother   . Diabetes Paternal Grandmother   . Hypertension Paternal Grandfather     Past Medical History:  Diagnosis Date  . Allergy   . 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 Hypothyroidism   . Hypoxemia 05/15/2014  . Lupus (HCC)   . Osteopenia   . Snoring 05/04/2014  . Thyroid disease   . UARS (upper airway resistance syndrome) 05/15/2014    Past Surgical History:  Procedure Laterality Date  . THYROIDECTOMY Left 2009    Current Outpatient Medications  Medication Sig Dispense Refill  . allopurinol (ZYLOPRIM) 300 MG tablet Take 300 mg by mouth 2 (two) times daily.    Marland Kitchen. conjugated estrogens (PREMARIN) vaginal cream Place 1 Applicatorful vaginally daily.    . NP  THYROID 120 MG tablet TK 1 T PO D    . Progesterone Micronized (PROGESTERONE PO) Take 1 tablet by mouth daily.    . Vitamin D, Ergocalciferol, (DRISDOL) 50000 units CAPS capsule TK ONE C PO ONCE WEEKLY  0   No current facility-administered medications for this visit.    Allergies as of 04/05/2020 - Review Complete 09/15/2019  Allergen Reaction Noted  . Influenza vaccines Hives 05/25/2014    Vitals: BP 126/81   Pulse 70   Ht 5\' 11"  (1.803 m)   Wt 249 lb (112.9 kg)   BMI 34.73 kg/m  Last Weight:  Wt Readings from Last 1 Encounters:  04/05/20 249 lb (112.9 kg)   Last Height:   Ht Readings from Last 1 Encounters:  04/05/20 5\' 11"  (1.803 m)    Physical exam:  General: The patient is awake, alert and appears not in acute distress.  The patient is well groomed, tanned. Head: Normocephalic, atraumatic. Neck is supple. Mallampati 4 , neck circumference:  17. 25 inches. No nasal septal deviation, no TMJ.  Habitual mouth breather.   Cardiovascular:  Regular rate and rhythm, without  murmurs or carotid bruit, and without distended neck veins. Respiratory: Lungs are clear to auscultation. Skin:  Without evidence of edema, or rash Trunk: BMI  remains elevated at 35 Kg/m2 again .Marland Kitchen. Neurologic exam :The patient is awake and alert, oriented to place and time.Speech is fluent without dysarthria, dysphonia or aphasia.  Cranial nerves: no loss of taste and smell. Pupils are equal and briskly reactive to light. Facial motor strength is symmetric and tongue and uvula move midline.  Tongue protrusion into both cheeks was strong.  Motor exam:  Normal tone , muscle bulk and symmetric normal strength in all extremities. Stance is stable and normal.  Deep tendon reflexes: in the upper and lower extremities are symmetric and intact.   Assessment:  After physical and neurologic examination, review of laboratory studies, imaging, neurophysiology testing and pre-existing records,   The patient uses a  nasal mask , a WISP in XL>  15 minute assessment is   1) OSA with UARS,  On a new CPAP  Wit continued good results and compliance.     Plan:  Treatment plan and additional workup : continue CPAP  treatment , RV with MD in 12 month.  Melvyn Novasarmen Lowen Mansouri, MD    04-05-2020  [place] Guardianc Dr.Tomlin, MD [facility].

## 2020-04-05 NOTE — Patient Instructions (Signed)

## 2021-04-09 ENCOUNTER — Ambulatory Visit: Payer: PRIVATE HEALTH INSURANCE | Admitting: Neurology

## 2021-04-09 ENCOUNTER — Encounter: Payer: Self-pay | Admitting: Neurology

## 2021-04-16 ENCOUNTER — Ambulatory Visit (INDEPENDENT_AMBULATORY_CARE_PROVIDER_SITE_OTHER): Payer: PRIVATE HEALTH INSURANCE | Admitting: Neurology

## 2021-04-16 VITALS — BP 171/81 | HR 92 | Ht 71.0 in | Wt 256.5 lb

## 2021-04-16 DIAGNOSIS — G478 Other sleep disorders: Secondary | ICD-10-CM

## 2021-04-16 DIAGNOSIS — G4710 Hypersomnia, unspecified: Secondary | ICD-10-CM

## 2021-04-16 DIAGNOSIS — G4733 Obstructive sleep apnea (adult) (pediatric): Secondary | ICD-10-CM

## 2021-04-16 DIAGNOSIS — Z9989 Dependence on other enabling machines and devices: Secondary | ICD-10-CM

## 2021-04-16 NOTE — Progress Notes (Addendum)
[facility]  Provider:  Melvyn Novas, M.D . Referring Provider: Primary Care Physician:  Dr Huntley Dec.     Interval History: 04-16-2021; Deborah Rosales is here with her excellent CPAP compliance data. Her father passed away in early 04/16/2023 and she was non compliant with CPAP, coming home form long distance and organize his belongings in [place]. The patient's compliance was for the first time ever below 60% do to that travel time.  Her dates from May were well over 90.  So I think this was a fluke I hope that her insurance will cover her supplies for the next month and that she is now going back on her high compliance.  The patient has a height of 5 foot 11 she is almost 6 foot tall she has currently a weight of 256 pounds, her BMI is 36 so I would like for her to consider continue with a low-carb diet and we discussed to use -maybe- Ozempic? Tis can help with reduction in blood pressure and glucose.      Interval history from 05 April 2020, has a prior pleasure of meeting with Tienna Bienkowski today and meanwhile 50 year old patient had long-term CPAP user whom I have followed for over 7 years.  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  She is using her CPAP compliantly at 85% with an average use at time of 5 hours 45 minutes, this is a persistent AutoSet air sense 10 machine that was renewed last year she has a minimum pressure setting of 6 maximum pressure of 13 and 1 cm expiratory pressure relief with an AHI of 1.7.  This is excellent resolution her 95th percentile pressure is 10.7 I do not need to make any changes I will asked the patient to continue with the best fitting interface which seems to work for her and to try to use the machine each day.  Her CPAP has been set  to 5-12 cm water and 3 cm EPR,      Summary & Diagnosis:    The patient's current AHI baseline is 20.1/h  and REM AHI 37.3/h.  no clinically significant hypoxia, moderate snoring.  This is REM dependent apnea and should be treated with CPAP.  Recommendations:      Her machine was issued in 2015 and needs to be replaced.  I will order an autotitration capable machine with heated  humidity and a pressure setting from 5-16 cm water and 2 cm EPR,  mask of her choice.    Interpreting Physician: Melvyn Novas, MD      Procedures by Melvyn Novas, MD at 09/26/2019 2:00 PM       09-15-2019, RV for Deborah Rosales, a meanwhile 50 year-old female CPAP user.  I the pleasure of meeting today with Deborah Rosales who has been a compliant CPAP user in the past, over the last 30 days she has had interrupted CPAP use and only reached 60% compliance.  Between the seventh and the 29th and a 3-week.  She used the machine 17 days so her gap is really the first week of November.  She is using a minimum pressure of 5 the maximum pressure of 12 and has an EPR setting of 3 cmH2O usually average time of CPAP use is 5-1/2 hours at night but for this time.  It had been 3 hours and 19 minutes averaging with a gap.  She endorsed 9  points on the Epworth sleepiness score she did not get her fatigue score controlled.    there is no evidence of significant depression, her sleep pattern has not significantly changed she is scheduled for thyroid biopsy tomorrow on 4 December and we wish her well.  Melvyn Novas, M.D.    05-05-2019, Interval History ; UMEKA WRENCH is a 50 y.o., left handed female patient living in [place] and using CPAP for OSA-  She has developed much better sleep since being treated with an estrogen cream, in AM and progesteron vaginally at night. She feels refreshed and restored.  She continues to use CPAP ; Deborah Rosales endorsed today the Epworth sleepiness score at 8 points endorsing question 1, 2 and 5 with 2 points each and the third and fourth question was 1.2 each.  Her fatigue severity score is much reduced  26 out of 36 points, and she also has continued to be a highly compliant CPAP user I have the data of the last 30 days with the last night being 29 April 2019 available, she uses the machine on average 6 hours and 2 minutes each night 93% compliance, minimum pressure of 5 maximum pressure of 12 with 3 cm EPR her machine is an air sense 10 AutoSet serial #[id number].  Residual AHI is 1.3 which is a perfect resolution of apnea and is 95th percentile pressure is 9.2 there is no Cheyne-Stokes respiration.  And she does have minimal air leaks.  No changes in settings or interface are needed.    I have the pleasure of meeting Deborah Rosales. [place] today and meanwhile 50 year old female patient who has been established with CPAP care.   Today 03 May 2018 the patient has used her machine 80% of days and 67% for time with an average of 5 hours and 3 minutes total use per night the average usage on days when she uses her machine is 6 hours 18 minutes.  She is using an AutoSet between 5 and 12 cmH2O pressure with 3 cm EPR and a residual AHI of 1.5 results.  This is a good control of her apnea.  The 95th percentile pressure is 8.7.  I asked the patient to use her CPAP machine even when she naps and to put a reminder next to her think that she will not forget to put the CPAP on.  She has no difficulties with using CPAP, but sometimes simply goes to bed and forgets it.  Her Epworth sleepiness score was endorsed at 10 points fatigue severity at 27 points, she does not appear depressed. I would like to follow yearly from their own. She works in [place]. Travelling  CPAP request.    HPI: interval history from 04/30/2017, I have pleasure of seeing Deborah Rosales today who has been calm using between 46 and the [place] for quite a while now. She has been compliant with her CPAP, but used another device for 6 days was in the last 30 days as she traveled. Her average user time on her main CPAP of 6 hours and 11 minutes. A residual AHI  is only 1.6 per hour and there are no central apneas emerging under CPAP treatment. She does have some moderate air leaks, but her pressures maintained very well. My goal is to introduce her to a mask that may be more tolerable and less prone to air leakage. She has been using a nasal pillow. She reports an air-leak between tubing and mask.  With CPAP  she has no nocturia and no headaches, no daytime sleepiness.       Kayleen MemosJanice L Athey is a 50 y.o., left handed  female, who is seen here as a referral  from Dr. Thornton PapasJeffrey Anderson  for CPAP compliance.  [name] had been evaluated for the possibility of sleep apnea based and on a constant of snoring and daytime fatigue,  as well as morning headaches. The evaluation took place in January 2013 in [place], [place] in [place] in a specialty clinic , by Ear, Nose and Throat physicians.  A home sleep test was performed and showed that but she had some desaturations ( oxygen levels) she seemed not to have frank apneas.  The study concluded that the data collection time was too short to give a valid reading. The recorded data encompassed 10 hours and 10 minutes but the validity of the recording state that only 10 minutes per day to that were usable She also underwent a nocturnal pulse oximetry which showed her pulse rate to be +/-15 around 60 beats per minute. The study gave no evidence of frank apneas. Soon after , [place] and had [facility] repeat this time in the lab study that she could finally not attend.  She's here today to see 2-1/2 years later if there is by no evidence of sleep apnea, evidence of upper airway resistance he, will just simply snoring.  In addition, her complaint of morning headaches after waking up has increased in intensity and frequency.  She is also considered extremely daytime sleepy. She has gained weight over the last 2-1/2 years one of her concerns is a  possibility of hypoventilation and CO2 retention.   Deborah. Donavan FoilBass is working as an Education officer, museummiddle school teacher in [place] , and states that during the school year she has a regular sleep routine which varies from her summer holiday time. In, she may read to 1 AM before she finally falls asleep. She also doesn't have a set rise time in summer. She has however noticed that she wakes up with headaches in the morning but is not awoken by headaches. She I was falls asleep promptly, and will sleep through the night unfortunately she will rely on more than one alarm to be able to rise in the morning. She does not wake up spontaneously. She often feels that she would like another couple of hours of sleep in the morning and feels neither refreshed nor restored. She doesn 't recall any dreams.  The patient often naps in the afternoon even after 8-10 hours of nocturnal sleep.  she does not wake up from a nap easily and that she sometimes falls asleep in the late afternoon When she wakes up , she has a generalized headache, throbbing , retreating over the early hours of the day.   She drinks caffeine , but not excessively- one cup in AM . No energy drinks. No SODA, no iced tea. On weekends she likes to drink Beer.  She is  physically active , teaches and takes Zumba classes. She does not use tobacco in any form,    05-07-14  PSG performed : The sleep study revealed mild apnea 11. 7  , 46 torr of CO2 retention.  But the patient had low oxygen levels (nadir 79%)  for a rather prolonged period of time. Her REM sleep dependent AHI was 11.7 RDI was 15.2 , supine  AHI was 16.4. The  meeting today to discuss what would be the best treatment option and I would advise for CPAP  treatment : this  mild sleep apnea with upper airway resistance syndrome can be both treated using positive airway therapy- in addition to pursue aggressive weight-loss strategies. She needs to avoid supine sleep, as  only when sleeping on her back is apnea  evident. We discussed the Tennisball method. She will resume weight watchers. She still has headaches in AM, hypnic headaches.  CPAP will allow for better hypoxemia control. CPAP to be set auto 5-12 cm water.  CD  Interval history from 05-14-15. Deborah. Gaber is here for her yearly revisit she came all the way from [place]. I and had a very mild form of apnea more accentuated upper airway resistance he syndrome and tolerate CPAP now very well. Her compliance is excellent 6 hours and 37 minutes on average at night 87% compliance for over 4 hours of consecutive use. She continues to use the multiple set and the AHI is 0.8. She has increased her exercise regimen but she has been frustrated that she hasn't lost weight in the meantime her Epworth sleepiness score is still mildly elevated at 12 points and her fatigue severity score is 40 points however these numbers are comparable to the last visit she was prior to using CPAP endorsing the Epworth score at 16 points.  Since she has a height of 6 foot 1 ", her BMI is 32.2.   Review of Systems: Out of a complete 14 system review, the patient complains of only the following symptoms, and all other reviewed systems are negative.  No longer nocturia. Epworth sleepiness score at 10 points and fatigue severity at 31 points. NO dreams, no cataplexy and no hallucinations or sleep paralysis.  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   How likely are you to doze in the following situations: 0 = not likely, 1 = slight chance, 2 = moderate chance, 3 = high chance  Sitting and Reading? Watching Television? Sitting inactive in a public place (theater or meeting)? Lying down in the afternoon when circumstances permit? Sitting and talking to someone? Sitting quietly after lunch without alcohol? In a car, while stopped for a few minutes in traffic? As a passenger in a car for an hour without a break?  Total = 8/ 24 - stayed the same level.    She sold her car in January- and she feels sleepier on long distance. Uses public transport now, 9 euro per summer month this year for all regional trains in [place].      Social History   Socioeconomic History   Marital status: Single    Spouse name: Not on file   Number of children: 0   Years of education: Masters   Highest education level: Not on file  Occupational History    Employer: [employer]   Tobacco Use   Smoking status: Never   Smokeless tobacco: Never  Substance and Sexual Activity   Alcohol use: Yes    Comment: occas.   Drug use: No   Sexual activity: Never  Other Topics Concern   Not on file  Social History Narrative   Patient is single and lives alone.   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   Patient is left-handed.   Patient drinks three cups of coffee per week and two cups of soda per week.   Social Determinants  of Health   Financial Resource Strain: Not on file  Food Insecurity: Not on file  Transportation Needs: Not on file  Physical Activity: Not on file  Stress: Not on file  Social Connections: Not on file  Intimate Partner Violence: Not on file    Family History  Problem Relation Age of Onset   Hyperlipidemia Mother    Hypertension Mother    Hypertension Father    Hypertension Brother    Mental retardation Sister    Hypertension Maternal Grandmother    Stroke Maternal Grandmother    Thyroid disease Maternal Grandmother    Hypertension Maternal Grandfather    Glaucoma Maternal Grandfather    Hypertension Paternal Grandmother    Heart disease Paternal Grandmother    Diabetes Paternal Grandmother    Hypertension Paternal Grandfather     Past Medical History:  Diagnosis Date   Allergy    Hypersomnia, persistent 05/04/2014   Hypothyroidism    Hypoxemia 05/15/2014   Lupus (HCC)    Osteopenia    Snoring 05/04/2014   Thyroid disease    UARS (upper airway resistance syndrome)  05/15/2014    Past Surgical History:  Procedure Laterality Date   THYROIDECTOMY Left 2009    Current Outpatient Medications  Medication Sig Dispense Refill   allopurinol (ZYLOPRIM) 300 MG tablet Take 300 mg by mouth 2 (two) times daily.     conjugated estrogens (PREMARIN) vaginal cream Place 1 Applicatorful vaginally daily.     NP THYROID 120 MG tablet TK 1 T PO D     Progesterone Micronized (PROGESTERONE PO) Take 1 tablet by mouth daily.     Vitamin D, Ergocalciferol, (DRISDOL) 50000 units CAPS capsule TK ONE C PO ONCE WEEKLY  0   No current facility-administered medications for this visit.    Allergies as of 04/16/2021 - Review Complete 04/16/2021  Allergen Reaction Noted   Influenza vaccines Hives 05/25/2014    Vitals: BP (!) 171/81   Pulse 92   Ht 5\' 11"  (1.803 m)   Wt 256 lb 8 oz (116.3 kg)   SpO2 95%   BMI 35.77 kg/m  Last Weight:  Wt Readings from Last 1 Encounters:  04/16/21 256 lb 8 oz (116.3 kg)   Last Height:   Ht Readings from Last 1 Encounters:  04/16/21 5\' 11"  (1.803 m)    Physical exam:  General: The patient is awake, alert and appears not in acute distress.  The patient is well groomed, tanned. Head: Normocephalic, atraumatic. Neck is supple. Mallampati 4 , neck circumference:  17. 25 inches. No nasal septal deviation, no TMJ.  Habitual mouth breather.   Cardiovascular:  Regular rate and rhythm, without  murmurs or carotid bruit, and without distended neck veins. Respiratory: Lungs are clear to auscultation. Skin:  Without evidence of edema, or rash Trunk: BMI  remains elevated at 35 Kg/m2 again .06/17/21 Neurologic exam :The patient is awake and alert, oriented to place and time. Speech is fluent without dysarthria, dysphonia or aphasia.  Cranial nerves: no loss of taste and smell. Pupils are equal and briskly reactive to light. Facial motor strength is symmetric and tongue and uvula move midline.  Tongue protrusion into both cheeks was strong.  Motor  exam:  Normal tone , muscle bulk and symmetric normal strength in all extremities. Stance is stable and normal.  Deep tendon reflexes: in the upper and lower extremities are symmetric and intact.   Assessment:  After physical and neurologic examination, review of laboratory studies, imaging, neurophysiology testing  and pre-existing records,   21 minute assessment is   1) OSA with UARS,  On a new CPAP  -With continued good results but with lower compliance. She promised to improve, needs new supplies.  The patient uses a nasal mask , a WISP in XL>  She will return to [place] August 5th.  We discussed strategies of weight loss, and given her thyroid history, I like for her to discuss Ozempic with her endocrinologist. Rv in 12 month-    Plan:  Treatment plan and additional workup : continue CPAP  treatment , RV with MD in 12 month.  Melvyn Novas, MD    04-05-2020  Harrietta Guardian, MD [facility].   Primary care physician is Dr. Gaetana Michaelis,  free practice. [place], [place].

## 2021-04-16 NOTE — Patient Instructions (Signed)
Semaglutide injection solution What is this medication? SEMAGLUTIDE (Sem a GLOO tide) is used to improve blood sugar control in adults with type 2 diabetes. This medicine may be used with other diabetes medicines. This drug may also reduce the risk of heart attack or stroke if you have type 2diabetes and risk factors for heart disease. This medicine may be used for other purposes; ask your health care provider orpharmacist if you have questions. COMMON BRAND NAME(S): OZEMPIC What should I tell my care team before I take this medication? They need to know if you have any of these conditions: endocrine tumors (MEN 2) or if someone in your family had these tumors eye disease, vision problems history of pancreatitis kidney disease stomach problems thyroid cancer or if someone in your family had thyroid cancer an unusual or allergic reaction to semaglutide, other medicines, foods, dyes, or preservatives pregnant or trying to get pregnant breast-feeding How should I use this medication? This medicine is for injection under the skin of your upper leg (thigh), stomach area, or upper arm. It is given once every week (every 7 days). You will be taught how to prepare and give this medicine. Use exactly as directed. Take your medicine at regular intervals. Do not take it more often thandirected. If you use this medicine with insulin, you should inject this medicine and the insulin separately. Do not mix them together. Do not give the injections rightnext to each other. Change (rotate) injection sites with each injection. It is important that you put your used needles and syringes in a special sharps container. Do not put them in a trash can. If you do not have a sharpscontainer, call your pharmacist or healthcare provider to get one. A special MedGuide will be given to you by the pharmacist with eachprescription and refill. Be sure to read this information carefully each time. This drug comes with  INSTRUCTIONS FOR USE. Ask your pharmacist for directions on how to use this drug. Read the information carefully. Talk to yourpharmacist or health care provider if you have questions. Talk to your pediatrician regarding the use of this medicine in children.Special care may be needed. Overdosage: If you think you have taken too much of this medicine contact apoison control center or emergency room at once. NOTE: This medicine is only for you. Do not share this medicine with others. What if I miss a dose? If you miss a dose, take it as soon as you can within 5 days after the missed dose. Then take your next dose at your regular weekly time. If it has been longer than 5 days after the missed dose, do not take the missed dose. Take the next dose at your regular time. Do not take double or extra doses. If you havequestions about a missed dose, contact your health care provider for advice. What may interact with this medication? other medicines for diabetes Many medications may cause changes in blood sugar, these include: alcohol containing beverages antiviral medicines for HIV or AIDS aspirin and aspirin-like drugs certain medicines for blood pressure, heart disease, irregular heart beat chromium diuretics female hormones, such as estrogens or progestins, birth control pills fenofibrate gemfibrozil isoniazid lanreotide female hormones or anabolic steroids MAOIs like Carbex, Eldepryl, Marplan, Nardil, and Parnate medicines for weight loss medicines for allergies, asthma, cold, or cough medicines for depression, anxiety, or psychotic disturbances niacin nicotine NSAIDs, medicines for pain and inflammation, like ibuprofen or naproxen octreotide pasireotide pentamidine phenytoin probenecid quinolone antibiotics such as ciprofloxacin, levofloxacin, ofloxacin   some herbal dietary supplements steroid medicines such as prednisone or cortisone sulfamethoxazole; trimethoprim thyroid hormones Some  medications can hide the warning symptoms of low blood sugar (hypoglycemia). You may need to monitor your blood sugar more closely if youare taking one of these medications. These include: beta-blockers, often used for high blood pressure or heart problems (examples include atenolol, metoprolol, propranolol) clonidine guanethidine reserpine This list may not describe all possible interactions. Give your health care provider a list of all the medicines, herbs, non-prescription drugs, or dietary supplements you use. Also tell them if you smoke, drink alcohol, or use illegaldrugs. Some items may interact with your medicine. What should I watch for while using this medication? Visit your doctor or health care professional for regular checks on yourprogress. Drink plenty of fluids while taking this medicine. Check with your doctor or health care professional if you get an attack of severe diarrhea, nausea, and vomiting. The loss of too much body fluid can make it dangerous for you to takethis medicine. A test called the HbA1C (A1C) will be monitored. This is a simple blood test. It measures your blood sugar control over the last 2 to 3 months. You willreceive this test every 3 to 6 months. Learn how to check your blood sugar. Learn the symptoms of low and high bloodsugar and how to manage them. Always carry a quick-source of sugar with you in case you have symptoms of low blood sugar. Examples include hard sugar candy or glucose tablets. Make sure others know that you can choke if you eat or drink when you develop serious symptoms of low blood sugar, such as seizures or unconsciousness. They must getmedical help at once. Tell your doctor or health care professional if you have high blood sugar. You might need to change the dose of your medicine. If you are sick or exercisingmore than usual, you might need to change the dose of your medicine. Do not skip meals. Ask your doctor or health care professional if  you should avoid alcohol. Many nonprescription cough and cold products contain sugar oralcohol. These can affect blood sugar. Pens should never be shared. Even if the needle is changed, sharing may resultin passing of viruses like hepatitis or HIV. Wear a medical ID bracelet or chain, and carry a card that describes yourdisease and details of your medicine and dosage times. Do not become pregnant while taking this medicine. Women should inform their doctor if they wish to become pregnant or think they might be pregnant. There is a potential for serious side effects to an unborn child. Talk to your healthcare professional or pharmacist for more information. What side effects may I notice from receiving this medication? Side effects that you should report to your doctor or health care professionalas soon as possible: allergic reactions like skin rash, itching or hives, swelling of the face, lips, or tongue breathing problems changes in vision diarrhea that continues or is severe lump or swelling on the neck severe nausea signs and symptoms of infection like fever or chills; cough; sore throat; pain or trouble passing urine signs and symptoms of low blood sugar such as feeling anxious, confusion, dizziness, increased hunger, unusually weak or tired, sweating, shakiness, cold, irritable, headache, blurred vision, fast heartbeat, loss of consciousness signs and symptoms of kidney injury like trouble passing urine or change in the amount of urine trouble swallowing unusual stomach upset or pain vomiting Side effects that usually do not require medical attention (report to yourdoctor or health care professional   if they continue or are bothersome): constipation diarrhea nausea pain, redness, or irritation at site where injected stomach upset This list may not describe all possible side effects. Call your doctor for medical advice about side effects. You may report side effects to FDA  at1-800-FDA-1088. Where should I keep my medication? Keep out of the reach of children. Store unopened pens in a refrigerator between 2 and 8 degrees C (36 and 46 degrees F). Do not freeze. Protect from light and heat. After you first use the pen, it can be stored for 56 days at room temperature between 15 and 30 degrees C (59 and 86 degrees F) or in a refrigerator. Throw away your used pen after 56days or after the expiration date, whichever comes first. Do not store your pen with the needle attached. If the needle is left on,medicine may leak from the pen. NOTE: This sheet is a summary. It may not cover all possible information. If you have questions about this medicine, talk to your doctor, pharmacist, orhealth care provider.  2022 Elsevier/Gold Standard (2019-06-14 09:41:51)  

## 2021-04-24 ENCOUNTER — Encounter: Payer: Self-pay | Admitting: Neurology

## 2021-10-10 ENCOUNTER — Telehealth: Payer: Self-pay | Admitting: Neurology

## 2021-10-10 NOTE — Telephone Encounter (Signed)
I have called the patient insurance information and attempted a precertification for a portable CPAP. An hour was spent on the call with the patient's insurance.  We initiated a precertification for the CPT code [phone].  They indicate that they need updated clinical notes and a letter stating why the patient would need a portable CPAP.  They also are requiring a NPI for the facility that the patient will be getting the machine from.  I have advised the patient that we would need the NPI or tax ID number from the facility she is planning to get the portable CPAP from.  Patient will be in touch with [place] to provide that information. Clinical notes will need to be faxed to the clinical review team [phone] In the case number pending is [id number].

## 2021-10-10 NOTE — Telephone Encounter (Signed)
Pt would like a call to discuss a request for a portable CPAP, please call.

## 2021-10-10 NOTE — Telephone Encounter (Signed)
Called the patient back. Pt states that she realizes that she just received a machine 1-2 yrs ago through her insurance but due to her travelling so much she would like to purchase a travel cpap. She is hoping that she can get possibly even half of it covered. She has contacted her insurance and they advised that the MD can call on her behalf to make a case on why this is necessary. The number she provided was [phone].   To make her case. She travels a minimum of 4x a year back and forth from [place]. She also travels [place] within [place] for school. She is in [employer] as well.  Her previous sleep study indicates moderate to severe sleep apnea.

## 2022-04-14 ENCOUNTER — Telehealth: Payer: Self-pay

## 2022-04-14 NOTE — Telephone Encounter (Signed)
Called and no answer. VM was left reminding pt to bring her CPAP machine and power cord to her appt, for a manual DL of data.

## 2022-04-16 ENCOUNTER — Encounter: Payer: Self-pay | Admitting: Neurology

## 2022-04-16 ENCOUNTER — Ambulatory Visit: Payer: PRIVATE HEALTH INSURANCE | Admitting: Neurology

## 2022-04-17 ENCOUNTER — Encounter: Payer: Self-pay | Admitting: Adult Health

## 2022-04-17 ENCOUNTER — Ambulatory Visit (INDEPENDENT_AMBULATORY_CARE_PROVIDER_SITE_OTHER): Payer: 59 | Admitting: Adult Health

## 2022-04-17 VITALS — BP 110/61 | HR 66 | Ht 71.0 in | Wt 232.2 lb

## 2022-04-17 DIAGNOSIS — G4733 Obstructive sleep apnea (adult) (pediatric): Secondary | ICD-10-CM

## 2022-04-17 DIAGNOSIS — Z9989 Dependence on other enabling machines and devices: Secondary | ICD-10-CM | POA: Diagnosis not present

## 2022-04-17 NOTE — Progress Notes (Signed)
PATIENT: Deborah Rosales DOB: August 05, 1971  REASON FOR VISIT: follow up HISTORY FROM: patient PRIMARY NEUROLOGIST: Dr. Vickey Huger  HISTORY OF PRESENT ILLNESS: Today 04/17/22:  Deborah Rosales is a 51 year old female with a history of OSA on CPAP. She returns today for follow-up. Reports CPAP is working well. She is on Ozempic and is trying to lose weight.    REVIEW OF SYSTEMS: Out of a complete 14 system review of symptoms, the patient complains only of the following symptoms, and all other reviewed systems are negative.  See HPI  ALLERGIES: Allergies  Allergen Reactions   Influenza Vaccines Hives    HOME MEDICATIONS: Outpatient Medications Prior to Visit  Medication Sig Dispense Refill   NP THYROID 120 MG tablet 150 mg.     Semaglutide (OZEMPIC, 2 MG/DOSE, [place]) Inject into the skin once a week.     Vitamin D, Ergocalciferol, (DRISDOL) 50000 units CAPS capsule TK ONE C PO ONCE WEEKLY  0   allopurinol (ZYLOPRIM) 300 MG tablet Take 300 mg by mouth 2 (two) times daily.     conjugated estrogens (PREMARIN) vaginal cream Place 1 Applicatorful vaginally daily.     Progesterone Micronized (PROGESTERONE PO) Take 1 tablet by mouth daily.     No facility-administered medications prior to visit.    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   Hypothyroidism    Hypoxemia 05/15/2014   Lupus (HCC)    Osteopenia    Snoring 05/04/2014   Thyroid disease    UARS (upper airway resistance syndrome) 05/15/2014    PAST SURGICAL HISTORY: Past Surgical History:  Procedure Laterality Date   THYROIDECTOMY Left 2009    FAMILY HISTORY: Family History  Problem Relation Age of Onset   Hyperlipidemia Mother    Hypertension Mother    Hypertension Father    Mental retardation Sister    Hypertension Brother    Hypertension Maternal Grandmother    Stroke Maternal Grandmother    Thyroid disease Maternal  Grandmother    Hypertension Maternal Grandfather    Glaucoma Maternal Grandfather    Hypertension Paternal Grandmother    Heart disease Paternal Grandmother    Diabetes Paternal Grandmother    Hypertension Paternal Grandfather    Sleep apnea Neg Hx     SOCIAL HISTORY: Social History   Socioeconomic History   Marital status: Single    Spouse name: Not on file   Number of children: 0   Years of education: Masters   Highest education level: Not on file  Occupational History    Employer: [employer]   Tobacco Use   Smoking status: Never   Smokeless tobacco: Never  Substance and Sexual Activity   Alcohol use: Yes    Comment: occas.   Drug use: No   Sexual activity: Never  Other Topics Concern   Not on file  Social History Narrative   Patient is single and lives alone.   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   Patient is left-handed.   Patient drinks three cups of coffee per week and two cups of soda per week.   Social Determinants of Health   Financial Resource Strain: Not on file  Food Insecurity: Not on file  Transportation Needs: Not on file  Physical Activity: Not on file  Stress: Not on file  Social Connections: Not on file  Intimate Partner Violence: Not on file      PHYSICAL EXAM  Vitals:   04/17/22 1143  BP: 110/61  Pulse: 66  Weight: 232 lb 3.2 oz (105.3 kg)  Height: 5\' 11"  (1.803 m)   Body mass index is 32.39 kg/m.  Generalized: Well developed, in no acute distress  Chest: Lungs clear to auscultation bilaterally  Neurological examination  Mentation: Alert oriented to time, place, history taking. Follows all commands speech and language fluent Cranial nerve II-XII: Extraocular movements were full, visual field were full on confrontational test Head turning and shoulder shrug  were normal and symmetric. Motor: The motor testing reveals 5 over 5 strength of all 4 extremities. Good symmetric motor  tone is noted throughout.  Sensory: Sensory testing is intact to soft touch on all 4 extremities. No evidence of extinction is noted.  Gait and station: Gait is normal.    DIAGNOSTIC DATA (LABS, IMAGING, TESTING) - I reviewed patient records, labs, notes, testing and imaging myself where available.  Lab Results  Component Value Date   WBC 11.7 (H) 05/25/2014   HGB 13.9 05/25/2014   HCT 40.3 05/25/2014   MCV 79.5 05/25/2014   PLT 256 05/25/2014      Component Value Date/Time   NA 139 05/25/2014 1826   K 4.1 05/25/2014 1826   CL 99 05/25/2014 1826   CO2 27 05/25/2014 1826   GLUCOSE 106 (H) 05/25/2014 1826   BUN 13 05/25/2014 1826   CREATININE 1.12 (H) 05/25/2014 1826   CREATININE 0.70 05/01/2014 1653   CALCIUM 10.1 05/25/2014 1826   PROT 9.0 (H) 05/25/2014 1826   ALBUMIN 4.3 05/25/2014 1826   AST 39 (H) 05/25/2014 1826   ALT 48 (H) 05/25/2014 1826   ALKPHOS 65 05/25/2014 1826   BILITOT 0.5 05/25/2014 1826   GFRNONAA 58 (L) 05/25/2014 1826   GFRAA 68 (L) 05/25/2014 1826   Lab Results  Component Value Date   CHOL 191 05/01/2014   HDL 55 05/01/2014   LDLCALC 116 (H) 05/01/2014   TRIG 102 05/01/2014   CHOLHDL 3.5 05/01/2014   Lab Results  Component Value Date   TSH 0.362 05/28/2015      ASSESSMENT AND PLAN 51 y.o. year old female  has a past medical history of Allergy, Diabetes (HCC), Hypersomnia, persistent (05/04/2014), Hypothyroidism, Hypoxemia (05/15/2014), Lupus (HCC), Osteopenia, Snoring (05/04/2014), Thyroid disease, and UARS (upper airway resistance syndrome) (05/15/2014). here with:  OSA on CPAP  - CPAP compliance excellent - Good treatment of AHI  - Encourage patient to use CPAP nightly and > 4 hours each night - F/U in 1 year or sooner if needed  07/15/2014, MSN, NP-C 04/17/2022, 11:53 AM [facility] [address] [place], [place] [phone]

## 2022-04-24 ENCOUNTER — Encounter: Payer: Self-pay | Admitting: Neurology

## 2022-04-28 ENCOUNTER — Telehealth: Payer: Self-pay | Admitting: Neurology

## 2022-04-28 NOTE — Telephone Encounter (Signed)
Pt completed a bilateral digital mammogram screening ordered by Dr Vickey Huger. Received the result  No significant abnormality or change ACR BI_RADS CATEGORY 1- negative Normal exam. Will send to Dr Vickey Huger to review as well

## 2022-04-28 NOTE — Telephone Encounter (Signed)
Normal mammogram , CD

## 2022-04-29 ENCOUNTER — Encounter: Payer: Self-pay | Admitting: Neurology

## 2022-04-29 NOTE — Telephone Encounter (Signed)
My chart message sent to pt,

## 2023-04-23 ENCOUNTER — Ambulatory Visit: Payer: PRIVATE HEALTH INSURANCE | Admitting: Adult Health

## 2023-04-23 ENCOUNTER — Encounter: Payer: Self-pay | Admitting: Adult Health
